# Patient Record
Sex: Female | Born: 1940
Health system: Southern US, Community
[De-identification: ages and names within clinical notes are randomized; demographics above are authoritative.]

## PROBLEM LIST (undated history)

## (undated) DIAGNOSIS — F32A Depression, unspecified: Secondary | ICD-10-CM

## (undated) DIAGNOSIS — E042 Nontoxic multinodular goiter: Secondary | ICD-10-CM

## (undated) DIAGNOSIS — C801 Malignant (primary) neoplasm, unspecified: Secondary | ICD-10-CM

## (undated) DIAGNOSIS — J449 Chronic obstructive pulmonary disease, unspecified: Secondary | ICD-10-CM

## (undated) DIAGNOSIS — K219 Gastro-esophageal reflux disease without esophagitis: Secondary | ICD-10-CM

## (undated) DIAGNOSIS — F329 Major depressive disorder, single episode, unspecified: Secondary | ICD-10-CM

## (undated) DIAGNOSIS — E785 Hyperlipidemia, unspecified: Secondary | ICD-10-CM

## (undated) DIAGNOSIS — I201 Angina pectoris with documented spasm: Secondary | ICD-10-CM

## (undated) DIAGNOSIS — M1990 Unspecified osteoarthritis, unspecified site: Secondary | ICD-10-CM

## (undated) DIAGNOSIS — F419 Anxiety disorder, unspecified: Secondary | ICD-10-CM

## (undated) DIAGNOSIS — I10 Essential (primary) hypertension: Secondary | ICD-10-CM

## (undated) HISTORY — PX: TUBAL LIGATION: SHX77

## (undated) HISTORY — PX: ABDOMINAL HYSTERECTOMY: SHX81

## (undated) HISTORY — DX: Essential (primary) hypertension: I10

## (undated) HISTORY — DX: Nontoxic multinodular goiter: E04.2

## (undated) HISTORY — DX: Malignant (primary) neoplasm, unspecified: C80.1

## (undated) HISTORY — DX: Anxiety disorder, unspecified: F41.9

## (undated) HISTORY — DX: Angina pectoris with documented spasm: I20.1

## (undated) HISTORY — PX: THYROIDECTOMY, PARTIAL: SHX18

## (undated) HISTORY — DX: Hyperlipidemia, unspecified: E78.5

## (undated) HISTORY — DX: Unspecified osteoarthritis, unspecified site: M19.90

## (undated) HISTORY — PX: VESICOVAGINAL FISTULA CLOSURE W/ TAH: SUR271

---

## 1998-08-29 ENCOUNTER — Encounter: Admission: RE | Admit: 1998-08-29 | Discharge: 1998-08-29 | Payer: Self-pay | Admitting: Internal Medicine

## 1998-09-11 ENCOUNTER — Encounter: Admission: RE | Admit: 1998-09-11 | Discharge: 1998-09-11 | Payer: Self-pay | Admitting: Internal Medicine

## 1998-11-14 ENCOUNTER — Encounter: Admission: RE | Admit: 1998-11-14 | Discharge: 1998-11-14 | Payer: Self-pay | Admitting: Internal Medicine

## 2000-03-10 ENCOUNTER — Emergency Department (HOSPITAL_COMMUNITY): Admission: EM | Admit: 2000-03-10 | Discharge: 2000-03-10 | Payer: Self-pay | Admitting: Emergency Medicine

## 2000-03-15 ENCOUNTER — Encounter: Admission: RE | Admit: 2000-03-15 | Discharge: 2000-03-15 | Payer: Self-pay | Admitting: Occupational Medicine

## 2000-03-15 ENCOUNTER — Encounter: Payer: Self-pay | Admitting: Occupational Medicine

## 2000-08-16 ENCOUNTER — Other Ambulatory Visit: Admission: RE | Admit: 2000-08-16 | Discharge: 2000-08-16 | Payer: Self-pay | Admitting: Internal Medicine

## 2000-12-26 ENCOUNTER — Ambulatory Visit (HOSPITAL_COMMUNITY): Admission: RE | Admit: 2000-12-26 | Discharge: 2000-12-26 | Payer: Self-pay | Admitting: *Deleted

## 2000-12-26 ENCOUNTER — Encounter (INDEPENDENT_AMBULATORY_CARE_PROVIDER_SITE_OTHER): Payer: Self-pay | Admitting: Specialist

## 2003-03-15 ENCOUNTER — Emergency Department (HOSPITAL_COMMUNITY): Admission: EM | Admit: 2003-03-15 | Discharge: 2003-03-16 | Payer: Self-pay | Admitting: Emergency Medicine

## 2004-02-12 ENCOUNTER — Encounter: Admission: RE | Admit: 2004-02-12 | Discharge: 2004-02-12 | Payer: Self-pay | Admitting: Internal Medicine

## 2007-04-21 ENCOUNTER — Ambulatory Visit (HOSPITAL_COMMUNITY): Admission: RE | Admit: 2007-04-21 | Discharge: 2007-04-21 | Payer: Self-pay | Admitting: Internal Medicine

## 2007-09-08 ENCOUNTER — Other Ambulatory Visit: Admission: RE | Admit: 2007-09-08 | Discharge: 2007-09-08 | Payer: Self-pay | Admitting: Internal Medicine

## 2007-09-14 ENCOUNTER — Encounter: Admission: RE | Admit: 2007-09-14 | Discharge: 2007-09-14 | Payer: Self-pay | Admitting: Internal Medicine

## 2008-04-15 ENCOUNTER — Encounter: Admission: RE | Admit: 2008-04-15 | Discharge: 2008-04-15 | Payer: Self-pay | Admitting: Internal Medicine

## 2008-09-24 ENCOUNTER — Encounter: Admission: RE | Admit: 2008-09-24 | Discharge: 2008-09-24 | Payer: Self-pay | Admitting: Internal Medicine

## 2008-09-25 ENCOUNTER — Encounter: Admission: RE | Admit: 2008-09-25 | Discharge: 2008-09-25 | Payer: Self-pay | Admitting: Internal Medicine

## 2008-10-01 ENCOUNTER — Encounter: Admission: RE | Admit: 2008-10-01 | Discharge: 2008-10-01 | Payer: Self-pay | Admitting: Internal Medicine

## 2008-10-01 ENCOUNTER — Emergency Department (HOSPITAL_COMMUNITY): Admission: EM | Admit: 2008-10-01 | Discharge: 2008-10-01 | Payer: Self-pay | Admitting: Emergency Medicine

## 2009-04-15 ENCOUNTER — Encounter: Admission: RE | Admit: 2009-04-15 | Discharge: 2009-04-15 | Payer: Self-pay | Admitting: Internal Medicine

## 2009-09-25 ENCOUNTER — Encounter: Admission: RE | Admit: 2009-09-25 | Discharge: 2009-09-25 | Payer: Self-pay | Admitting: Internal Medicine

## 2009-12-09 ENCOUNTER — Ambulatory Visit: Payer: Self-pay | Admitting: Cardiology

## 2009-12-22 ENCOUNTER — Emergency Department (HOSPITAL_COMMUNITY)
Admission: EM | Admit: 2009-12-22 | Discharge: 2009-12-22 | Payer: Self-pay | Source: Home / Self Care | Admitting: Emergency Medicine

## 2010-01-25 ENCOUNTER — Encounter: Payer: Self-pay | Admitting: Internal Medicine

## 2010-03-16 LAB — CBC
HCT: 40.6 % (ref 36.0–46.0)
Hemoglobin: 13.5 g/dL (ref 12.0–15.0)
MCH: 31 pg (ref 26.0–34.0)
MCHC: 33.3 g/dL (ref 30.0–36.0)
MCV: 93.1 fL (ref 78.0–100.0)
RDW: 14 % (ref 11.5–15.5)

## 2010-03-16 LAB — BASIC METABOLIC PANEL
BUN: 13 mg/dL (ref 6–23)
CO2: 20 mEq/L (ref 19–32)
GFR calc non Af Amer: >60 mL/min (ref >60)
Glucose, Bld: 122 mg/dL — ABNORMAL HIGH (ref 70–99)
Potassium: 3.4 mEq/L — ABNORMAL LOW (ref 3.5–5.1)
Sodium: 136 mEq/L (ref 135–145)

## 2010-03-16 LAB — DIFFERENTIAL
Basophils Absolute: 0.1 10*3/uL (ref 0.0–0.1)
Basophils Relative: 1 % (ref 0–1)
Eosinophils Absolute: 0.2 10*3/uL (ref 0.0–0.7)
Eosinophils Relative: 1 % (ref 0–5)
Monocytes Absolute: 2.2 10*3/uL — ABNORMAL HIGH (ref 0.1–1.0)
Monocytes Relative: 15 % — ABNORMAL HIGH (ref 3–12)
Neutro Abs: 9.9 10*3/uL — ABNORMAL HIGH (ref 1.7–7.7)

## 2010-05-22 NOTE — Op Note (Signed)
[facility]  Patient:    Sherri King, Sherri King Visit Number: [id number] MRN: [id number]          Service Type: END Location: ENDO Attending Physician:  Sabino Gasser Dictated by:   Sabino Gasser, M.D. Proc. Date: 12/26/00 Admit Date:  12/26/2000                             Operative Report  PROCEDURE:  Colonoscopy with biopsy.  INDICATIONS:  Colon polyp.  ANESTHESIA:  Demerol 80 mg, Versed 9 mg.  DESCRIPTION OF PROCEDURE:  With the patient mildly sedated in the left lateral decubitus position, the Olympus videoscopic colonoscope was inserted into the rectum and passed under direct vision into the cecum. The cecum was identified by the ileocecal valve and appendiceal orifice both of which were photographed. From this point, the colonoscope was slowly withdrawn taking circumferential views of the entire colonic mucosa, stopping only at 20 cm for an anal verge at which point a small polyp was seen, photographed, and removed using hot biopsy forceps.  Technique:  With setting of 20/20 blended current, we pulled back all the way to the rectum which appeared normal under direct view and then pulled through the anal canal in retroflex view. The patients vital signs and pulse oximeter reading remained stable. The patient tolerated the procedure well without apparent complications.  FINDINGS:  Small polyp at 20 cm, otherwise, unremarkable examination.  PLAN:  Await biopsy report. The patient is to call Dr. Virginia Rochester for result and follow up with Dr. Virginia Rochester as an outpatient. Dictated by:   Sabino Gasser, M.D. Attending Physician:  Sabino Gasser DD:  12/26/00 TD:  12/26/00 Job: [id number]

## 2011-04-06 ENCOUNTER — Encounter: Payer: Self-pay | Admitting: *Deleted

## 2011-05-26 ENCOUNTER — Encounter (INDEPENDENT_AMBULATORY_CARE_PROVIDER_SITE_OTHER): Payer: Medicare Other | Admitting: Ophthalmology

## 2011-05-26 DIAGNOSIS — H2510 Age-related nuclear cataract, unspecified eye: Secondary | ICD-10-CM

## 2011-05-26 DIAGNOSIS — H3530 Unspecified macular degeneration: Secondary | ICD-10-CM

## 2011-05-26 DIAGNOSIS — H43819 Vitreous degeneration, unspecified eye: Secondary | ICD-10-CM

## 2011-06-07 ENCOUNTER — Ambulatory Visit (INDEPENDENT_AMBULATORY_CARE_PROVIDER_SITE_OTHER): Payer: Medicare Other | Admitting: Cardiology

## 2011-06-07 ENCOUNTER — Encounter: Payer: Self-pay | Admitting: Cardiology

## 2011-06-07 VITALS — BP 140/80 | HR 83 | Ht 64.0 in | Wt 170.0 lb

## 2011-06-07 DIAGNOSIS — I201 Angina pectoris with documented spasm: Secondary | ICD-10-CM | POA: Insufficient documentation

## 2011-06-07 DIAGNOSIS — I10 Essential (primary) hypertension: Secondary | ICD-10-CM

## 2011-06-07 DIAGNOSIS — Z720 Tobacco use: Secondary | ICD-10-CM | POA: Insufficient documentation

## 2011-06-07 DIAGNOSIS — R0789 Other chest pain: Secondary | ICD-10-CM

## 2011-06-07 DIAGNOSIS — F17200 Nicotine dependence, unspecified, uncomplicated: Secondary | ICD-10-CM

## 2011-06-07 NOTE — Assessment & Plan Note (Signed)
Her current chest pain symptoms are very atypical and do not suggest a cardiac source. Her ECG is normal. Her exam is normal.

## 2011-06-07 NOTE — Patient Instructions (Signed)
Continue your current therapy.  Quit smoking completely.  I will see you back in 1 year.

## 2011-06-07 NOTE — Assessment & Plan Note (Signed)
Well controlled on isosorbide. I stressed the importance of smoking cessation.

## 2011-06-07 NOTE — Progress Notes (Signed)
Sherri King Date of Birth: [birth year] Medical Record #[id number]  History of Present Illness: [name] is seen today for yearly followup. She reports that she has done well over the past year. Her husband did pass away this year with metastatic prostate cancer. She has experienced some intermittent atypical chest pains. She has a pain in her left axilla that can occur at any time and lasts only a couple of seconds. At other times she feels a shooting pain through her breasts. She complains that she sweats a lot. She is trying to quit smoking and is currently smoking a half pack per day. She's also using a nicotine patch. She carries a diagnosis of vasospastic coronary disease. This is based on cardiac catheterization in 2009 which demonstrated an area of narrowing in the mid LAD that resolved with nitroglycerin. She has been on chronic isosorbide.  Current Outpatient Prescriptions on File Prior to Visit  Medication Sig Dispense Refill  . aspirin 81 MG tablet Take 81 mg by mouth daily.      Marland Kitchen atorvastatin (LIPITOR) 80 MG tablet Take 40 mg by mouth daily.       Marland Kitchen darifenacin (ENABLEX) 7.5 MG 24 hr tablet Take 7.5 mg by mouth daily.      . isosorbide mononitrate (IMDUR) 60 MG 24 hr tablet Take 60 mg by mouth daily.      . sertraline (ZOLOFT) 100 MG tablet Take 100 mg by mouth daily.      Marland Kitchen triamterene-hydrochlorothiazide (MAXZIDE-25) 37.5-25 MG per tablet Take 1 tablet by mouth daily.      . VENTOLIN HFA 108 (90 BASE) MCG/ACT inhaler         No Known Allergies  Past Medical History  Diagnosis Date  . Coronary artery disease   . Hypertension   . Hyperlipidemia   . Anxiety   . Cancer     cervical  . Arthritis     Past Surgical History  Procedure Date  . Vesicovaginal fistula closure w/ tah   . Breast surgery     removal of several nodules from right breast    History  Smoking status  . Current Everyday Smoker -- 0.5 packs/day for 40 years  Smokeless tobacco  . Not on  file    History  Alcohol Use  . Yes    occasional beer    Family History  Problem Relation Age of Onset  . Heart failure Mother   . Stroke Mother   . Cancer Father     lung  . Cancer Sister   . Cancer Sister   . Heart disease Sister     Review of Systems: The review of systems is positive for arthritis.  All other systems were reviewed and are negative.  Physical Exam: BP 140/80  Pulse 83  Ht 5\' 4"  (1.626 m)  Wt 170 lb (77.111 kg)  BMI 29.18 kg/m2 She is a pleasant white female in no acute distress.The patient is alert and oriented x 3.  The mood and affect are normal.  The skin is warm and dry.  Color is normal.  The HEENT exam reveals that the sclera are nonicteric.  The mucous membranes are moist.  The carotids are 2+ without bruits.  There is no thyromegaly.  There is no JVD.  The lungs are clear.  The chest wall is non tender.  The heart exam reveals a regular rate with a normal S1 and S2.  There are no murmurs, gallops, or rubs.  The PMI is not displaced.   Abdominal exam reveals good bowel sounds.  There is no guarding or rebound.  There is no hepatosplenomegaly or tenderness.  There are no masses.  Exam of the legs reveal no clubbing, cyanosis, or edema.  The legs are without rashes.  The distal pulses are intact.  Cranial nerves II - XII are intact.  Motor and sensory functions are intact.  The gait is normal.  LABORATORY DATA:  ECG today is normal. Assessment / Plan:

## 2011-06-07 NOTE — Assessment & Plan Note (Signed)
I encouraged her to completely stop smoking. I cautioned her against using nicotine patches at the same time she was continuing to smoke.

## 2012-03-02 ENCOUNTER — Other Ambulatory Visit: Payer: Self-pay | Admitting: Gastroenterology

## 2012-03-16 ENCOUNTER — Other Ambulatory Visit: Payer: Self-pay | Admitting: Internal Medicine

## 2012-03-23 ENCOUNTER — Ambulatory Visit
Admission: RE | Admit: 2012-03-23 | Discharge: 2012-03-23 | Disposition: A | Discharge status: Home / Self Care | Payer: Medicare Other | Source: Ambulatory Visit | Attending: Internal Medicine | Admitting: Internal Medicine

## 2012-05-25 ENCOUNTER — Ambulatory Visit (INDEPENDENT_AMBULATORY_CARE_PROVIDER_SITE_OTHER): Payer: Medicare Other | Admitting: Ophthalmology

## 2012-05-31 ENCOUNTER — Ambulatory Visit (INDEPENDENT_AMBULATORY_CARE_PROVIDER_SITE_OTHER): Payer: Self-pay | Admitting: Ophthalmology

## 2012-06-19 ENCOUNTER — Ambulatory Visit (INDEPENDENT_AMBULATORY_CARE_PROVIDER_SITE_OTHER): Payer: Self-pay | Admitting: Ophthalmology

## 2013-03-12 ENCOUNTER — Other Ambulatory Visit: Payer: Self-pay | Admitting: Internal Medicine

## 2013-03-12 ENCOUNTER — Telehealth: Payer: Self-pay

## 2013-03-12 DIAGNOSIS — E041 Nontoxic single thyroid nodule: Secondary | ICD-10-CM

## 2013-03-12 DIAGNOSIS — R1011 Right upper quadrant pain: Secondary | ICD-10-CM

## 2013-03-12 NOTE — Telephone Encounter (Signed)
Patient called received lab work from [facility] noticed last office visit with Dr.Jordan 06/07/11.Appointment scheduled with Dr.Jordan 04/19/13 at 9:15 am.

## 2013-03-19 ENCOUNTER — Ambulatory Visit
Admission: RE | Admit: 2013-03-19 | Discharge: 2013-03-19 | Disposition: A | Discharge status: Home / Self Care | Payer: Medicare Other | Source: Ambulatory Visit | Attending: Internal Medicine | Admitting: Internal Medicine

## 2013-03-19 DIAGNOSIS — E041 Nontoxic single thyroid nodule: Secondary | ICD-10-CM

## 2013-03-19 DIAGNOSIS — R1011 Right upper quadrant pain: Secondary | ICD-10-CM

## 2013-04-19 ENCOUNTER — Ambulatory Visit: Payer: Medicare Other | Admitting: Cardiology

## 2013-05-04 ENCOUNTER — Ambulatory Visit (INDEPENDENT_AMBULATORY_CARE_PROVIDER_SITE_OTHER): Payer: Medicare Other | Admitting: Nurse Practitioner

## 2013-05-04 ENCOUNTER — Encounter: Payer: Self-pay | Admitting: Nurse Practitioner

## 2013-05-04 VITALS — BP 130/80 | HR 74 | Ht 63.0 in | Wt 156.0 lb

## 2013-05-04 DIAGNOSIS — R0789 Other chest pain: Secondary | ICD-10-CM

## 2013-05-04 DIAGNOSIS — F17200 Nicotine dependence, unspecified, uncomplicated: Secondary | ICD-10-CM

## 2013-05-04 DIAGNOSIS — I201 Angina pectoris with documented spasm: Secondary | ICD-10-CM

## 2013-05-04 DIAGNOSIS — Z720 Tobacco use: Secondary | ICD-10-CM

## 2013-05-04 DIAGNOSIS — I10 Essential (primary) hypertension: Secondary | ICD-10-CM

## 2013-05-04 DIAGNOSIS — E785 Hyperlipidemia, unspecified: Secondary | ICD-10-CM | POA: Insufficient documentation

## 2013-05-04 MED ORDER — NITROGLYCERIN 0.4 MG SL SUBL: 0.4000 mg | SUBLINGUAL_TABLET | SUBLINGUAL | Status: DC | PRN

## 2013-05-04 NOTE — Patient Instructions (Signed)
I think you are doing well except for the smoking - try to work on stopping  I will send in a RX for NTG to have on hand.  Use your NTG under your tongue for recurrent chest pain. May take one tablet every 5 minutes. If you are still having discomfort after 3 tablets in 15 minutes, call 911.  See Dr. Martinique in a year  Call the [place] office at [phone] if you have any questions, problems or concerns.

## 2013-05-04 NOTE — Progress Notes (Signed)
Sherri King Date of Birth: 1940/11/11 Medical Record #[id number]  History of Present Illness: [name] is seen back today for a follow up visit. Seen for Sherri King. She has ongoing tobacco abuse, vasospastic CAD with last cath in 2009 showing an area of narrowing in the mid LAD that resolved with NTG. She is on chronic nitrate therapy. Other issues include HTN and HLD.   Last seen here in June of 2013.   Comes in today. [name] No chest pain. Says everything is "fine". She is off all of her cardiac medicines and lipid lowering agents. Has been off for a couple of years she says. Stopped because she was "doing fine". Continues to smoke. Actively trying to lose weight due to a "fatty liver". Labs back in February with Sherri King reviewed.    Current Outpatient Prescriptions  Medication Sig Dispense Refill  . acetaminophen (TYLENOL) 500 MG tablet Take 500 mg by mouth every 6 (six) hours as needed.      Marland Kitchen buPROPion (WELLBUTRIN SR) 150 MG 12 hr tablet Take 150 mg by mouth 2 (two) times daily.      . carboxymethylcellulose (REFRESH PLUS) 0.5 % SOLN Place 1 drop into both eyes daily.      . cholecalciferol (VITAMIN D) 1000 UNITS tablet Take 1,000 Units by mouth 2 (two) times daily.      . cyclobenzaprine (FLEXERIL) 10 MG tablet Take 10 mg by mouth at bedtime.      . mirabegron ER (MYRBETRIQ) 25 MG TB24 tablet Take 25 mg by mouth daily.      . Multiple Vitamins-Minerals (MULTIVITAMIN WITH MINERALS) tablet Take 1 tablet by mouth daily.      Marland Kitchen omeprazole (PRILOSEC) 20 MG capsule Take 20 mg by mouth daily.      . sertraline (ZOLOFT) 100 MG tablet Take 100 mg by mouth daily.      . VENTOLIN HFA 108 (90 BASE) MCG/ACT inhaler       . vitamin C (ASCORBIC ACID) 500 MG tablet Take 500 mg by mouth 2 (two) times daily.       No current facility-administered medications for this visit.    No Known Allergies  Past Medical History  Diagnosis Date  . Coronary vasospasm   .  Hypertension   . Hyperlipidemia   . Anxiety   . Cancer     cervical  . Arthritis   . Multiple thyroid nodules     Past Surgical History  Procedure Laterality Date  . Vesicovaginal fistula closure w/ tah    . Breast surgery      removal of several nodules from right breast    History  Smoking status  . Current Every Day Smoker -- 0.50 packs/day for 40 years  Smokeless tobacco  . Not on file    History  Alcohol Use  . Yes    Comment: occasional beer    Family History  Problem Relation Age of Onset  . Heart failure Mother   . Stroke Mother   . Cancer Father     lung  . Cancer Sister   . Cancer Sister   . Heart disease Sister     Review of Systems: The review of systems is per the HPI.  All other systems were reviewed and are negative.  Physical Exam: BP 130/80  Pulse 74  Ht 5\' 3"  (1.6 m)  Wt 156 lb (70.761 kg)  BMI 27.64 kg/m2 Patient is very pleasant and in no  acute distress. She has lost 14 pounds in almost 2 years. Skin is warm and dry. Color is normal.  HEENT is unremarkable. Normocephalic/atraumatic. PERRL. Sclera are nonicteric. Neck is supple. No masses. No JVD. Lungs are clear. Cardiac exam shows a regular rate and rhythm. Abdomen is soft. Extremities are without edema. Gait and ROM are intact. No gross neurologic deficits noted.  Wt Readings from Last 3 Encounters:  05/04/13 156 lb (70.761 kg)  06/07/11 170 lb (77.111 kg)     LABORATORY DATA:  Lab Results  Component Value Date   WBC 14.3* 12/22/2009   HGB 13.5 12/22/2009   HCT 40.6 12/22/2009   PLT 228 12/22/2009   GLUCOSE 122* 12/22/2009   NA 136 12/22/2009   K 3.4* 12/22/2009   CL 105 12/22/2009   CREATININE 0.57 12/22/2009   BUN 13 12/22/2009   CO2 20 12/22/2009     Assessment / Plan:  1. Vasospastic CAD - no symptoms reported. She is off her nitrates and doing ok. Would like for her to have sl NTG on hand - this was sent in to her drug store. See back in a year.   2. Tobacco  abuse - cessation encouraged.   3. HTN - BP is good. She is no longer on medicines.   4. HLD - off treatment and actively losing weight. Labs by PCP.  See back in a year.   Patient is agreeable to this plan and will call if any problems develop in the interim.   Burtis Junes, RN, [place] [address] [place], [place]  [address] [phone]

## 2013-10-10 ENCOUNTER — Encounter: Payer: Self-pay | Admitting: Cardiology

## 2014-01-04 HISTORY — PX: BREAST BIOPSY: SHX20

## 2014-01-04 HISTORY — PX: BREAST SURGERY: SHX581

## 2014-04-26 ENCOUNTER — Telehealth: Payer: Self-pay | Admitting: Cardiology

## 2014-04-26 NOTE — Telephone Encounter (Signed)
Received records from [facility] for appointment on 06/21/14 with Dr Martinique.  Records given to [facility] (medical records) for Dr Doug Sou schedule on 06/21/14. lp

## 2014-05-02 ENCOUNTER — Other Ambulatory Visit: Payer: Self-pay | Admitting: Gastroenterology

## 2014-06-21 ENCOUNTER — Ambulatory Visit (INDEPENDENT_AMBULATORY_CARE_PROVIDER_SITE_OTHER): Payer: Medicare Other | Admitting: Cardiology

## 2014-06-21 ENCOUNTER — Encounter: Payer: Self-pay | Admitting: Cardiology

## 2014-06-21 VITALS — BP 146/87 | HR 75 | Ht 64.0 in | Wt 170.3 lb

## 2014-06-21 DIAGNOSIS — I201 Angina pectoris with documented spasm: Secondary | ICD-10-CM | POA: Diagnosis not present

## 2014-06-21 DIAGNOSIS — Z720 Tobacco use: Secondary | ICD-10-CM | POA: Diagnosis not present

## 2014-06-21 DIAGNOSIS — I10 Essential (primary) hypertension: Secondary | ICD-10-CM

## 2014-06-21 NOTE — Patient Instructions (Signed)
Continue your current therapy  Focus on restricting your salt intake   Congratulations on quitting smoking.  I will see you in one year.

## 2014-06-21 NOTE — Progress Notes (Signed)
Sherri King Date of Birth: 1940/07/17 Medical Record #[id number]  History of Present Illness: Sherri King is seen for follow up coronary vasospasm.  She has a history of  tobacco abuse, vasospastic CAD with last cath in 2009 showing an area of narrowing in the mid LAD that resolved with NTG. She was previously on nitrate therapy but this was stopped over a year ago. Other issues include HTN and HLD.  On follow up today she is doing well. No angina. Rare sharp pain. She is off all of her cardiac medicines and lipid lowering agents. Has been off for a couple of years. She did quit smoking in April.    Current Outpatient Prescriptions  Medication Sig Dispense Refill  . acetaminophen (TYLENOL) 500 MG tablet Take 500 mg by mouth every 6 (six) hours as needed.    Marland Kitchen buPROPion (WELLBUTRIN SR) 150 MG 12 hr tablet Take 150 mg by mouth 2 (two) times daily.    . carboxymethylcellulose (REFRESH PLUS) 0.5 % SOLN Place 1 drop into both eyes daily.    . cholecalciferol (VITAMIN D) 1000 UNITS tablet Take 1,000 Units by mouth 2 (two) times daily.    . cyclobenzaprine (FLEXERIL) 10 MG tablet Take 10 mg by mouth at bedtime.    . mirabegron ER (MYRBETRIQ) 25 MG TB24 tablet Take 25 mg by mouth daily.    . Multiple Vitamins-Minerals (MULTIVITAMIN WITH MINERALS) tablet Take 1 tablet by mouth daily.    . nitroGLYCERIN (NITROSTAT) 0.4 MG SL tablet Place 1 tablet (0.4 mg total) under the tongue every 5 (five) minutes as needed for chest pain. 25 tablet 3  . omeprazole (PRILOSEC) 20 MG capsule Take 20 mg by mouth daily.    . sertraline (ZOLOFT) 100 MG tablet Take 100 mg by mouth daily.    . vitamin C (ASCORBIC ACID) 500 MG tablet Take 500 mg by mouth 2 (two) times daily.     No current facility-administered medications for this visit.    No Known Allergies  Past Medical History  Diagnosis Date  . Coronary vasospasm   . Hypertension   . Hyperlipidemia   . Anxiety   . Cancer     cervical  . Arthritis    . Multiple thyroid nodules     Past Surgical History  Procedure Laterality Date  . Vesicovaginal fistula closure w/ tah    . Breast surgery      removal of several nodules from right breast    History  Smoking status  . Current Every Day Smoker -- 0.50 packs/day for 40 years  Smokeless tobacco  . Not on file    History  Alcohol Use  . Yes    Comment: occasional beer    Family History  Problem Relation Age of Onset  . Heart failure Mother   . Stroke Mother   . Cancer Father     lung  . Cancer Sister   . Cancer Sister   . Heart disease Sister     Review of Systems: The review of systems is per the HPI.  She does note some swelling in her ankle. She does eat a lot of salt. All other systems were reviewed and are negative.  Physical Exam: BP 146/87 mmHg  Pulse 75  Ht 5\' 4"  (1.626 m)  Wt 77.248 kg (170 lb 4.8 oz)  BMI 29.22 kg/m2 Patient is very pleasant and in no acute distress.  Skin is warm and dry. Color is normal.  HEENT is unremarkable.  Normocephalic/atraumatic. PERRL. Sclera are nonicteric. Neck is supple. No masses. No JVD. Lungs are clear. Cardiac exam shows a regular rate and rhythm. Normal S1-2. Abdomen is soft. Extremities 1+ edema. Gait and ROM are intact. No gross neurologic deficits noted.  Wt Readings from Last 3 Encounters:  06/21/14 77.248 kg (170 lb 4.8 oz)  05/04/13 70.761 kg (156 lb)  06/07/11 77.111 kg (170 lb)     LABORATORY DATA:  Lab Results  Component Value Date   WBC 14.3* 12/22/2009   HGB 13.5 12/22/2009   HCT 40.6 12/22/2009   PLT 228 12/22/2009   GLUCOSE 122* 12/22/2009   NA 136 12/22/2009   K 3.4* 12/22/2009   CL 105 12/22/2009   CREATININE 0.57 12/22/2009   BUN 13 12/22/2009   CO2 20 12/22/2009   Ecg today shows NSR with normal Ecg. I have personally reviewed and interpreted this study.   Assessment / Plan:  1. Vasospastic CAD - no symptoms reported. She is off her nitrates and doing ok. I think the most important  thing is that she quit smoking.  See back in a year.   2. Tobacco abuse - now stopped.  3. HTN - BP is fair. On no meds. Encourage sodium restriction.  4. HLD - off treatment and actively losing weight. Labs by PCP.  See back in a year.

## 2014-07-30 ENCOUNTER — Other Ambulatory Visit (HOSPITAL_COMMUNITY): Payer: Self-pay | Admitting: Internal Medicine

## 2014-07-30 DIAGNOSIS — Z1231 Encounter for screening mammogram for malignant neoplasm of breast: Secondary | ICD-10-CM

## 2014-08-07 ENCOUNTER — Ambulatory Visit (HOSPITAL_COMMUNITY)
Admission: RE | Admit: 2014-08-07 | Discharge: 2014-08-07 | Disposition: A | Discharge status: Home / Self Care | Payer: Medicare Other | Source: Ambulatory Visit | Attending: Internal Medicine | Admitting: Internal Medicine

## 2014-08-07 DIAGNOSIS — Z1231 Encounter for screening mammogram for malignant neoplasm of breast: Secondary | ICD-10-CM | POA: Insufficient documentation

## 2014-08-09 ENCOUNTER — Other Ambulatory Visit: Payer: Self-pay | Admitting: Internal Medicine

## 2014-08-09 DIAGNOSIS — R928 Other abnormal and inconclusive findings on diagnostic imaging of breast: Secondary | ICD-10-CM

## 2014-08-15 ENCOUNTER — Ambulatory Visit
Admission: RE | Admit: 2014-08-15 | Discharge: 2014-08-15 | Disposition: A | Discharge status: Home / Self Care | Payer: Medicare Other | Source: Ambulatory Visit | Attending: Internal Medicine | Admitting: Internal Medicine

## 2014-08-15 ENCOUNTER — Other Ambulatory Visit: Payer: Self-pay | Admitting: Internal Medicine

## 2014-08-15 DIAGNOSIS — R928 Other abnormal and inconclusive findings on diagnostic imaging of breast: Secondary | ICD-10-CM

## 2014-08-21 ENCOUNTER — Other Ambulatory Visit: Payer: Self-pay | Admitting: Internal Medicine

## 2014-08-21 ENCOUNTER — Ambulatory Visit
Admission: RE | Admit: 2014-08-21 | Discharge: 2014-08-21 | Disposition: A | Discharge status: Home / Self Care | Payer: Medicare Other | Source: Ambulatory Visit | Attending: Internal Medicine | Admitting: Internal Medicine

## 2014-08-21 DIAGNOSIS — R928 Other abnormal and inconclusive findings on diagnostic imaging of breast: Secondary | ICD-10-CM

## 2014-08-21 HISTORY — PX: BREAST BIOPSY: SHX20

## 2014-09-23 ENCOUNTER — Telehealth: Payer: Self-pay

## 2014-09-23 NOTE — Telephone Encounter (Signed)
Received surgical clearance from [facility].Dr.Jordan cleared patient for upcoming surgery.Clearance faxed back to fax # [phone].

## 2014-10-05 HISTORY — PX: BREAST EXCISIONAL BIOPSY: SUR124

## 2014-10-08 ENCOUNTER — Other Ambulatory Visit: Payer: Self-pay | Admitting: General Surgery

## 2014-10-08 DIAGNOSIS — D242 Benign neoplasm of left breast: Secondary | ICD-10-CM

## 2014-10-08 DIAGNOSIS — D249 Benign neoplasm of unspecified breast: Secondary | ICD-10-CM | POA: Insufficient documentation

## 2014-10-09 ENCOUNTER — Other Ambulatory Visit: Payer: Self-pay | Admitting: General Surgery

## 2014-10-09 DIAGNOSIS — D242 Benign neoplasm of left breast: Secondary | ICD-10-CM

## 2014-10-28 ENCOUNTER — Ambulatory Visit
Admission: RE | Admit: 2014-10-28 | Discharge: 2014-10-28 | Disposition: A | Discharge status: Home / Self Care | Payer: Medicare Other | Source: Ambulatory Visit | Attending: General Surgery | Admitting: General Surgery

## 2014-10-28 ENCOUNTER — Encounter (HOSPITAL_BASED_OUTPATIENT_CLINIC_OR_DEPARTMENT_OTHER)
Admission: RE | Admit: 2014-10-28 | Discharge: 2014-10-28 | Disposition: A | Discharge status: Home / Self Care | Payer: Medicare Other | Source: Ambulatory Visit | Attending: General Surgery | Admitting: General Surgery

## 2014-10-28 ENCOUNTER — Encounter (HOSPITAL_BASED_OUTPATIENT_CLINIC_OR_DEPARTMENT_OTHER): Payer: Self-pay | Admitting: *Deleted

## 2014-10-28 DIAGNOSIS — N6022 Fibroadenosis of left breast: Secondary | ICD-10-CM | POA: Diagnosis not present

## 2014-10-28 DIAGNOSIS — Z79899 Other long term (current) drug therapy: Secondary | ICD-10-CM | POA: Diagnosis not present

## 2014-10-28 DIAGNOSIS — I10 Essential (primary) hypertension: Secondary | ICD-10-CM | POA: Diagnosis not present

## 2014-10-28 DIAGNOSIS — D242 Benign neoplasm of left breast: Secondary | ICD-10-CM

## 2014-10-28 DIAGNOSIS — N63 Unspecified lump in breast: Secondary | ICD-10-CM | POA: Diagnosis present

## 2014-10-28 DIAGNOSIS — J449 Chronic obstructive pulmonary disease, unspecified: Secondary | ICD-10-CM | POA: Diagnosis not present

## 2014-10-28 DIAGNOSIS — Z87891 Personal history of nicotine dependence: Secondary | ICD-10-CM | POA: Diagnosis not present

## 2014-10-28 LAB — CBC WITH DIFFERENTIAL/PLATELET
Basophils Absolute: 0.1 10*3/uL (ref 0.0–0.1)
Basophils Relative: 1 %
Eosinophils Absolute: 0.7 10*3/uL (ref 0.0–0.7)
Eosinophils Relative: 9 %
HCT: 43 % (ref 36.0–46.0)
Hemoglobin: 13.9 g/dL (ref 12.0–15.0)
LYMPHS PCT: 24 %
Lymphs Abs: 2.1 10*3/uL (ref 0.7–4.0)
MCH: 30.6 pg (ref 26.0–34.0)
MCHC: 32.3 g/dL (ref 30.0–36.0)
MCV: 94.7 fL (ref 78.0–100.0)
MONOS PCT: 14 %
Monocytes Absolute: 1.2 10*3/uL — ABNORMAL HIGH (ref 0.1–1.0)
Neutro Abs: 4.7 10*3/uL (ref 1.7–7.7)
Neutrophils Relative %: 54 %
Platelets: 244 10*3/uL (ref 150–400)
RBC: 4.54 MIL/uL (ref 3.87–5.11)
RDW: 12.9 % (ref 11.5–15.5)
WBC: 8.7 10*3/uL (ref 4.0–10.5)

## 2014-10-28 LAB — BASIC METABOLIC PANEL
Anion gap: 5 (ref 5–15)
BUN: 11 mg/dL (ref 6–20)
CHLORIDE: 109 mmol/L (ref 101–111)
CO2: 27 mmol/L (ref 22–32)
CREATININE: 0.52 mg/dL (ref 0.44–1.00)
Calcium: 9.5 mg/dL (ref 8.9–10.3)
GFR calc Af Amer: >60 mL/min (ref >60)
GFR calc non Af Amer: >60 mL/min (ref >60)
GLUCOSE: 110 mg/dL — AB (ref 65–99)
POTASSIUM: 4.5 mmol/L (ref 3.5–5.1)
SODIUM: 141 mmol/L (ref 135–145)

## 2014-10-31 ENCOUNTER — Ambulatory Visit (HOSPITAL_BASED_OUTPATIENT_CLINIC_OR_DEPARTMENT_OTHER): Payer: Medicare Other | Admitting: Anesthesiology

## 2014-10-31 ENCOUNTER — Encounter (HOSPITAL_BASED_OUTPATIENT_CLINIC_OR_DEPARTMENT_OTHER): Payer: Self-pay | Admitting: Anesthesiology

## 2014-10-31 ENCOUNTER — Encounter (HOSPITAL_BASED_OUTPATIENT_CLINIC_OR_DEPARTMENT_OTHER)
Admission: RE | Disposition: A | Discharge status: Home / Self Care | Payer: Self-pay | Source: Ambulatory Visit | Attending: General Surgery

## 2014-10-31 ENCOUNTER — Ambulatory Visit (HOSPITAL_BASED_OUTPATIENT_CLINIC_OR_DEPARTMENT_OTHER)
Admission: RE | Admit: 2014-10-31 | Discharge: 2014-10-31 | Disposition: A | Discharge status: Home / Self Care | Payer: Medicare Other | Source: Ambulatory Visit | Attending: General Surgery | Admitting: General Surgery

## 2014-10-31 ENCOUNTER — Ambulatory Visit
Admission: RE | Admit: 2014-10-31 | Discharge: 2014-10-31 | Disposition: A | Discharge status: Home / Self Care | Payer: Medicare Other | Source: Ambulatory Visit | Attending: General Surgery | Admitting: General Surgery

## 2014-10-31 DIAGNOSIS — N6022 Fibroadenosis of left breast: Secondary | ICD-10-CM | POA: Diagnosis not present

## 2014-10-31 DIAGNOSIS — J449 Chronic obstructive pulmonary disease, unspecified: Secondary | ICD-10-CM | POA: Diagnosis not present

## 2014-10-31 DIAGNOSIS — I10 Essential (primary) hypertension: Secondary | ICD-10-CM | POA: Diagnosis not present

## 2014-10-31 DIAGNOSIS — D242 Benign neoplasm of left breast: Secondary | ICD-10-CM

## 2014-10-31 DIAGNOSIS — Z87891 Personal history of nicotine dependence: Secondary | ICD-10-CM | POA: Diagnosis not present

## 2014-10-31 DIAGNOSIS — Z79899 Other long term (current) drug therapy: Secondary | ICD-10-CM | POA: Insufficient documentation

## 2014-10-31 HISTORY — DX: Chronic obstructive pulmonary disease, unspecified: J44.9

## 2014-10-31 HISTORY — PX: RADIOACTIVE SEED GUIDED EXCISIONAL BREAST BIOPSY: SHX6490

## 2014-10-31 HISTORY — DX: Depression, unspecified: F32.A

## 2014-10-31 HISTORY — DX: Major depressive disorder, single episode, unspecified: F32.9

## 2014-10-31 HISTORY — DX: Gastro-esophageal reflux disease without esophagitis: K21.9

## 2014-10-31 SURGERY — RADIOACTIVE SEED GUIDED BREAST BIOPSY
Anesthesia: General | Site: Breast | Laterality: Left

## 2014-10-31 MED ORDER — LIDOCAINE HCL (CARDIAC) 20 MG/ML IV SOLN
INTRAVENOUS | Status: AC
  Filled 2014-10-31: qty 5

## 2014-10-31 MED ORDER — PROPOFOL 10 MG/ML IV BOLUS
INTRAVENOUS | Status: DC | PRN
  Administered 2014-10-31: 150 mg via INTRAVENOUS

## 2014-10-31 MED ORDER — LIDOCAINE HCL (CARDIAC) 20 MG/ML IV SOLN
INTRAVENOUS | Status: DC | PRN
  Administered 2014-10-31: 50 mg via INTRAVENOUS

## 2014-10-31 MED ORDER — MIDAZOLAM HCL 2 MG/2ML IJ SOLN: 1.0000 mg | INTRAMUSCULAR | Status: DC | PRN

## 2014-10-31 MED ORDER — DEXAMETHASONE SODIUM PHOSPHATE 10 MG/ML IJ SOLN
INTRAMUSCULAR | Status: AC
  Filled 2014-10-31: qty 1

## 2014-10-31 MED ORDER — DEXAMETHASONE SODIUM PHOSPHATE 4 MG/ML IJ SOLN
INTRAMUSCULAR | Status: DC | PRN
  Administered 2014-10-31: 10 mg via INTRAVENOUS

## 2014-10-31 MED ORDER — PROPOFOL 10 MG/ML IV BOLUS
INTRAVENOUS | Status: AC
  Filled 2014-10-31: qty 20

## 2014-10-31 MED ORDER — PHENYLEPHRINE HCL 10 MG/ML IJ SOLN
INTRAMUSCULAR | Status: DC | PRN
  Administered 2014-10-31 (×2): 40 ug via INTRAVENOUS

## 2014-10-31 MED ORDER — HYDROCODONE-ACETAMINOPHEN 10-325 MG PO TABS: 1.0000 | ORAL_TABLET | Freq: Four times a day (QID) | ORAL | Status: AC | PRN

## 2014-10-31 MED ORDER — FENTANYL CITRATE (PF) 100 MCG/2ML IJ SOLN
INTRAMUSCULAR | Status: AC
  Filled 2014-10-31: qty 4

## 2014-10-31 MED ORDER — GLYCOPYRROLATE 0.2 MG/ML IJ SOLN: 0.2000 mg | Freq: Once | INTRAMUSCULAR | Status: DC | PRN

## 2014-10-31 MED ORDER — LACTATED RINGERS IV SOLN
INTRAVENOUS | Status: DC
  Administered 2014-10-31: 13:00:00 via INTRAVENOUS

## 2014-10-31 MED ORDER — FENTANYL CITRATE (PF) 100 MCG/2ML IJ SOLN: 50.0000 ug | INTRAMUSCULAR | Status: DC | PRN

## 2014-10-31 MED ORDER — CEFAZOLIN SODIUM-DEXTROSE 2-3 GM-% IV SOLR
2.0000 g | INTRAVENOUS | Status: AC
  Administered 2014-10-31: 2 g via INTRAVENOUS

## 2014-10-31 MED ORDER — OXYCODONE HCL 5 MG PO TABS
ORAL_TABLET | ORAL | Status: AC
  Filled 2014-10-31: qty 1

## 2014-10-31 MED ORDER — ONDANSETRON HCL 4 MG/2ML IJ SOLN
INTRAMUSCULAR | Status: AC
  Filled 2014-10-31: qty 2

## 2014-10-31 MED ORDER — CEFAZOLIN SODIUM-DEXTROSE 2-3 GM-% IV SOLR
INTRAVENOUS | Status: AC
  Filled 2014-10-31: qty 50

## 2014-10-31 MED ORDER — MEPERIDINE HCL 25 MG/ML IJ SOLN: 6.2500 mg | INTRAMUSCULAR | Status: DC | PRN

## 2014-10-31 MED ORDER — OXYCODONE HCL 5 MG PO TABS
5.0000 mg | ORAL_TABLET | Freq: Once | ORAL | Status: AC
Start: 2014-10-31 — End: 2014-10-31
  Administered 2014-10-31: 5 mg via ORAL

## 2014-10-31 MED ORDER — SCOPOLAMINE 1 MG/3DAYS TD PT72: 1.0000 | MEDICATED_PATCH | Freq: Once | TRANSDERMAL | Status: DC | PRN

## 2014-10-31 MED ORDER — BUPIVACAINE HCL (PF) 0.25 % IJ SOLN
INTRAMUSCULAR | Status: DC | PRN
  Administered 2014-10-31: 10 mL

## 2014-10-31 MED ORDER — FENTANYL CITRATE (PF) 100 MCG/2ML IJ SOLN
25.0000 ug | INTRAMUSCULAR | Status: DC | PRN
Start: 2014-10-31 — End: 2014-10-31

## 2014-10-31 MED ORDER — FENTANYL CITRATE (PF) 100 MCG/2ML IJ SOLN
INTRAMUSCULAR | Status: DC | PRN
  Administered 2014-10-31: 25 ug via INTRAVENOUS
  Administered 2014-10-31: 100 ug via INTRAVENOUS
  Administered 2014-10-31 (×3): 25 ug via INTRAVENOUS

## 2014-10-31 SURGICAL SUPPLY — 67 items
APPLIER CLIP 9.375 MED OPEN (MISCELLANEOUS)
APR CLP MED 9.3 20 MLT OPN (MISCELLANEOUS)
BINDER BREAST LRG (GAUZE/BANDAGES/DRESSINGS) ×2 IMPLANT
BINDER BREAST MEDIUM (GAUZE/BANDAGES/DRESSINGS) IMPLANT
BINDER BREAST XLRG (GAUZE/BANDAGES/DRESSINGS) IMPLANT
BINDER BREAST XXLRG (GAUZE/BANDAGES/DRESSINGS) IMPLANT
BLADE SURG 15 STRL LF DISP TIS (BLADE) ×1 IMPLANT
BLADE SURG 15 STRL SS (BLADE) ×3
CANISTER SUC SOCK COL 7IN (MISCELLANEOUS) IMPLANT
CANISTER SUCT 1200ML W/VALVE (MISCELLANEOUS) IMPLANT
CHLORAPREP W/TINT 26ML (MISCELLANEOUS) ×3 IMPLANT
CLIP APPLIE 9.375 MED OPEN (MISCELLANEOUS) IMPLANT
CLOSURE WOUND 1/2 X4 (GAUZE/BANDAGES/DRESSINGS) ×1
COVER BACK TABLE 60X90IN (DRAPES) ×3 IMPLANT
COVER MAYO STAND STRL (DRAPES) ×3 IMPLANT
COVER PROBE W GEL 5X96 (DRAPES) ×3 IMPLANT
DECANTER SPIKE VIAL GLASS SM (MISCELLANEOUS) IMPLANT
DEVICE DUBIN W/COMP PLATE 8390 (MISCELLANEOUS) ×3 IMPLANT
DRAPE LAPAROSCOPIC ABDOMINAL (DRAPES) ×3 IMPLANT
DRAPE UTILITY XL STRL (DRAPES) ×3 IMPLANT
DRSG TEGADERM 4X4.75 (GAUZE/BANDAGES/DRESSINGS) IMPLANT
ELECT COATED BLADE 2.86 ST (ELECTRODE) ×3 IMPLANT
ELECT REM PT RETURN 9FT ADLT (ELECTROSURGICAL) ×3
ELECTRODE REM PT RTRN 9FT ADLT (ELECTROSURGICAL) ×1 IMPLANT
GLOVE BIO SURGEON STRL SZ7 (GLOVE) ×6 IMPLANT
GLOVE BIOGEL M 7.0 STRL (GLOVE) ×2 IMPLANT
GLOVE BIOGEL PI IND STRL 7.0 (GLOVE) IMPLANT
GLOVE BIOGEL PI IND STRL 7.5 (GLOVE) ×1 IMPLANT
GLOVE BIOGEL PI IND STRL 8 (GLOVE) IMPLANT
GLOVE BIOGEL PI INDICATOR 7.0 (GLOVE) ×2
GLOVE BIOGEL PI INDICATOR 7.5 (GLOVE) ×2
GLOVE BIOGEL PI INDICATOR 8 (GLOVE) ×2
GLOVE SURG SS PI 7.0 STRL IVOR (GLOVE) ×2 IMPLANT
GOWN STRL REUS W/ TWL LRG LVL3 (GOWN DISPOSABLE) ×2 IMPLANT
GOWN STRL REUS W/ TWL XL LVL3 (GOWN DISPOSABLE) IMPLANT
GOWN STRL REUS W/TWL LRG LVL3 (GOWN DISPOSABLE) ×6
GOWN STRL REUS W/TWL XL LVL3 (GOWN DISPOSABLE) ×3
ILLUMINATOR WAVEGUIDE N/F (MISCELLANEOUS) IMPLANT
KIT MARKER MARGIN INK (KITS) ×3 IMPLANT
LIGHT WAVEGUIDE WIDE FLAT (MISCELLANEOUS) IMPLANT
LIQUID BAND (GAUZE/BANDAGES/DRESSINGS) ×3 IMPLANT
MARKER SKIN DUAL TIP RULER LAB (MISCELLANEOUS) ×3 IMPLANT
NDL HYPO 25X1 1.5 SAFETY (NEEDLE) ×1 IMPLANT
NEEDLE HYPO 25X1 1.5 SAFETY (NEEDLE) ×3 IMPLANT
NS IRRIG 1000ML POUR BTL (IV SOLUTION) ×2 IMPLANT
PACK BASIN DAY SURGERY FS (CUSTOM PROCEDURE TRAY) ×3 IMPLANT
PENCIL BUTTON HOLSTER BLD 10FT (ELECTRODE) ×3 IMPLANT
SHEET MEDIUM DRAPE 40X70 STRL (DRAPES) IMPLANT
SLEEVE SCD COMPRESS KNEE MED (MISCELLANEOUS) ×3 IMPLANT
SPONGE GAUZE 4X4 12PLY STER LF (GAUZE/BANDAGES/DRESSINGS) IMPLANT
SPONGE LAP 4X18 X RAY DECT (DISPOSABLE) ×3 IMPLANT
STAPLER VISISTAT 35W (STAPLE) IMPLANT
STRIP CLOSURE SKIN 1/2X4 (GAUZE/BANDAGES/DRESSINGS) ×2 IMPLANT
SUT MNCRL AB 4-0 PS2 18 (SUTURE) ×2 IMPLANT
SUT MON AB 5-0 PS2 18 (SUTURE) IMPLANT
SUT SILK 2 0 SH (SUTURE) IMPLANT
SUT VIC AB 2-0 SH 27 (SUTURE) ×3
SUT VIC AB 2-0 SH 27XBRD (SUTURE) ×1 IMPLANT
SUT VIC AB 3-0 SH 27 (SUTURE) ×3
SUT VIC AB 3-0 SH 27X BRD (SUTURE) ×1 IMPLANT
SUT VIC AB 5-0 PS2 18 (SUTURE) IMPLANT
SYR CONTROL 10ML LL (SYRINGE) ×3 IMPLANT
TOWEL OR 17X24 6PK STRL BLUE (TOWEL DISPOSABLE) ×3 IMPLANT
TOWEL OR NON WOVEN STRL DISP B (DISPOSABLE) ×3 IMPLANT
TUBE CONNECTING 20'X1/4 (TUBING) ×1
TUBE CONNECTING 20X1/4 (TUBING) ×1 IMPLANT
YANKAUER SUCT BULB TIP NO VENT (SUCTIONS) ×2 IMPLANT

## 2014-10-31 NOTE — Anesthesia Preprocedure Evaluation (Signed)
Anesthesia Evaluation    Airway Mallampati: I  TM Distance: >3 FB Neck ROM: Full    Dental  (+) Teeth Intact, Dental Advisory Given   Pulmonary COPD,  COPD inhaler, former smoker,    breath sounds clear to auscultation       Cardiovascular hypertension,  Rhythm:Regular Rate:Normal     Neuro/Psych    GI/Hepatic   Endo/Other    Renal/GU      Musculoskeletal   Abdominal   Peds  Hematology   Anesthesia Other Findings   Reproductive/Obstetrics                             Anesthesia Physical Anesthesia Plan  ASA: III  Anesthesia Plan: General   Post-op Pain Management:    Induction: Intravenous  Airway Management Planned:   Additional Equipment:   Intra-op Plan:   Post-operative Plan: Extubation in OR  Informed Consent: I have reviewed the patients History and Physical, chart, labs and discussed the procedure including the risks, benefits and alternatives for the proposed anesthesia with the patient or authorized representative who has indicated his/her understanding and acceptance.   Dental advisory given  Plan Discussed with: CRNA, Anesthesiologist and Surgeon  Anesthesia Plan Comments:         Anesthesia Quick Evaluation

## 2014-10-31 NOTE — H&P (Signed)
  74 yof who has history of benign breast lesions excised on both sides with fh in a niece presents with 9 mm calcs found on mm on left side. density is c. stereo biopsy shows a intraductal papiloma with fa. this is concordant. she is referred for evaluation. she does not have any complaints referable to either breast. no masses or discharge.    Other Problems  Anxiety Disorder Cervical Cancer Chronic Obstructive Lung Disease Depression Gastric Ulcer Gastroesophageal Reflux Disease High blood pressure Hypercholesterolemia  Past Surgical History  Cataract Surgery Right.  Allergies  No Known Drug Allergies09/13/2016  Medication History  Omeprazole (20MG  Capsule DR, Oral) Active. Wellbutrin XL (150MG  Tablet ER 24HR, Oral) Active. Carboxymethylcellulose Sodium (0.5% Solution, Ophthalmic) Active. Vitamin D (1000UNIT Tablet, Oral) Active. Flexeril (10MG  Tablet, Oral) Active. Myrbetriq (25MG  Tablet ER 24HR, Oral) Active. Multiple Vitamin (Oral) Active. Nitrostat (0.4MG  Tab Sublingual, Sublingual) Active. Zoloft (100MG  Tablet, Oral) Active. Vitamin C (500MG  Capsule, Oral) Active. Medications Reconciled  Social History  Caffeine use Coffee. No drug use Tobacco use Former smoker.  Family History  Alcohol Abuse Father, Sister. Arthritis Mother. Cerebrovascular Accident Mother. Diabetes Mellitus Mother, Sister. Heart Disease Mother, Sister.  Pregnancy / Birth History  Age of menopause <45 Gravida 3 Irregular periods Maternal age 52-20 Para 3  Review of Systems General Present- Weight Gain. Not Present- Appetite Loss, Chills, Fatigue, Fever, Night Sweats and Weight Loss. HEENT Present- Ringing in the Ears and Seasonal Allergies. Not Present- Earache, Hearing Loss, Hoarseness, Nose Bleed, Oral Ulcers, Sinus Pain, Sore Throat, Visual Disturbances, Wears glasses/contact lenses and Yellow Eyes. Respiratory Present- Difficulty Breathing and  Wheezing. Not Present- Bloody sputum, Chronic Cough and Snoring. Breast Present- Breast Mass. Not Present- Breast Pain, Nipple Discharge and Skin Changes. Gastrointestinal Present- Excessive gas. Not Present- Abdominal Pain, Bloating, Bloody Stool, Change in Bowel Habits, Chronic diarrhea, Constipation, Difficulty Swallowing, Gets full quickly at meals, Hemorrhoids, Indigestion, Nausea, Rectal Pain and Vomiting. Female Genitourinary Present- Frequency and Urgency. Not Present- Nocturia, Painful Urination and Pelvic Pain. Musculoskeletal Present- Back Pain, Joint Stiffness and Swelling of Extremities. Not Present- Joint Pain, Muscle Pain and Muscle Weakness. Psychiatric Present- Bipolar and Depression. Not Present- Anxiety, Change in Sleep Pattern, Fearful and Frequent crying.  Vitals  Weight: 173 lb Height: 64in Body Surface Area: 1.88 m Body Mass Index: 29.7 kg/m  Temp.: 97.10F(Temporal)  Pulse: 85 (Regular)  BP: 128/76 (Sitting, Left Arm, Standard)  Physical Exam  General Mental Status-Alert. Orientation-Oriented X3. Chest and Lung Exam Chest and lung exam reveals -on auscultation, normal breath sounds, no adventitious sounds and normal vocal resonance. Breast Nipples-No Discharge. Breast Lump-No Palpable Breast Mass. Cardiovascular Cardiovascular examination reveals -normal heart sounds, regular rate and rhythm with no murmurs. Lymphatic Head & Neck General Head & Neck Lymphatics: Bilateral - Description - Normal. Axillary General Axillary Region: Bilateral - Description - Normal. Note: no  adenopathy  Assessment & Plan  PAPILLOMA OF BREAST (D24.9) Story: Left breast seed guided excisional biopsy we discussed observation vs surgery. she desires surgery. will get cardiology clearance first. then plan for seed guided excision.

## 2014-10-31 NOTE — Op Note (Signed)
Preoperative diagnosis: right breast mass, core c/w papilloma Postoperative diagnosis: same as above Procedure: Right breast radioactive seed guided excisional biopsy Surgeon: Dr Serita Grammes Anesthesia: general EBL: minimal Drains none Complications none Specimen rigth breast tissue marked with paint Sponge and needle count was correct to completion Suspicion to recovery stable  Indications: This is 31 yof with a right breast mass on mm. This underwent core biopsy and is likely a papilloma  We discussed rsl excision of the mass vs observation and she desired excision. The seed was placed prior to beginning. I had the mamograms in the OR  Procedure: After informed consent was obtained the patient was taken to the operating room. She was given antibiotics. Sequential compression devices were on her legs. She was then placed under general anesthesia without complication. She was prepped and draped in the standard sterile surgical fashion. A surgical timeout was then performed.  I infiltrated quarter percent Marcaine overlying the seed in the superior breast made an incision. I then used the neoprobe to identify the radioactive seed and then removed this.Hemostasis was observed. I painted the specimen. A mammogram was taken confirming removal of the clip and the seed.  The clip was not immediately adjacent to the seed and I removed an area that felt like hematoma cavity which contained the clip These were both on a margin but this is down on the muscle and benign lesion on core. There was no radioactivity remaining in the breast. I then closed this with 2-0 Vicryl, 3-0 Vicryl, and 4-0 Monocryl. Glue was placed over the wound. A breast binder was placed. She tolerated this well was extubated and transferred to recovery in stable condition

## 2014-10-31 NOTE — Anesthesia Postprocedure Evaluation (Signed)
  Anesthesia Post-op Note  Patient: Sherri King  Procedure(s) Performed: Procedure(s): RADIOACTIVE SEED GUIDED EXCISIONAL LEFT BREAST BIOPSY (Left)  Patient Location: PACU  Anesthesia Type: General   Level of Consciousness: awake, alert  and oriented  Airway and Oxygen Therapy: Patient Spontanous Breathing  Post-op Pain: mild  Post-op Assessment: Post-op Vital signs reviewed  Post-op Vital Signs: Reviewed  Last Vitals:  Filed Vitals:   10/31/14 1430  BP: 151/81  Pulse: 79  Temp:   Resp: 17    Complications: No apparent anesthesia complications

## 2014-10-31 NOTE — Discharge Instructions (Signed)
[facility] °Office Phone Number [phone] ° °POST OP INSTRUCTIONS ° °Always review your discharge instruction sheet given to you by the facility where your surgery was performed. ° °IF YOU HAVE DISABILITY OR FAMILY LEAVE FORMS, YOU MUST BRING THEM TO THE OFFICE FOR PROCESSING.  DO NOT GIVE THEM TO YOUR DOCTOR. ° °1. A prescription for pain medication may be given to you upon discharge.  Take your pain medication as prescribed, if needed.  If narcotic pain medicine is not needed, then you may take acetaminophen (Tylenol), naprosyn (Alleve) or ibuprofen (Advil) as needed. °2. Take your usually prescribed medications unless otherwise directed °3. If you need a refill on your pain medication, please contact your pharmacy.  They will contact our office to request authorization.  Prescriptions will not be filled after 5pm or on week-ends. °4. You should eat very light the first 24 hours after surgery, such as soup, crackers, pudding, etc.  Resume your normal diet the day after surgery. °5. Most patients will experience some swelling and bruising in the breast.  Ice packs and a good support bra will help.  Wear the breast binder provided or a sports bra for 72 hours day and night.  After that wear a sports bra during the day until you return to the office. Swelling and bruising can take several days to resolve.  °6. It is common to experience some constipation if taking pain medication after surgery.  Increasing fluid intake and taking a stool softener will usually help or prevent this problem from occurring.  A mild laxative (Milk of Magnesia or Miralax) should be taken according to package directions if there are no bowel movements after 48 hours. °7. Unless discharge instructions indicate otherwise, you may remove your bandages 48 hours after surgery and you may shower at that time.  You may have steri-strips (small skin tapes) in place directly over the incision.  These strips should be left on the  skin for 7-10 days and will come off on their own.  If your surgeon used skin glue on the incision, you may shower in 24 hours.  The glue will flake off over the next 2-3 weeks.  Any sutures or staples will be removed at the office during your follow-up visit. °8. ACTIVITIES:  You may resume regular daily activities (gradually increasing) beginning the next day.  Wearing a good support bra or sports bra minimizes pain and swelling.  You may have sexual intercourse when it is comfortable. °a. You may drive when you no longer are taking prescription pain medication, you can comfortably wear a seatbelt, and you can safely maneuver your car and apply brakes. °b. RETURN TO WORK:  ______________________________________________________________________________________ °9. You should see your doctor in the office for a follow-up appointment approximately two weeks after your surgery.  Your doctor’s nurse will typically make your follow-up appointment when she calls you with your pathology report.  Expect your pathology report 3-4 business days after your surgery.  You may call to check if you do not hear from us after three days. °10. OTHER INSTRUCTIONS: _______________________________________________________________________________________________ _____________________________________________________________________________________________________________________________________ °_____________________________________________________________________________________________________________________________________ °_____________________________________________________________________________________________________________________________________ ° °WHEN TO CALL DR WAKEFIELD: °1. Fever over 101.0 °2. Nausea and/or vomiting. °3. Extreme swelling or bruising. °4. Continued bleeding from incision. °5. Increased pain, redness, or drainage from the incision. ° °The clinic staff is available to answer your questions during regular  business hours.  Please don’t hesitate to call and ask to speak to one of the nurses for clinical concerns.  If   you have a medical emergency, go to the nearest emergency room or call 911.  A surgeon from [facility] is always on call at the hospital. ° °For further questions, please visit [email or web address] ° ° ° °Post Anesthesia Home Care Instructions ° °Activity: °Get plenty of rest for the remainder of the day. A responsible adult should stay with you for 24 hours following the procedure.  °For the next 24 hours, DO NOT: °-Drive a car °-Operate machinery °-Drink alcoholic beverages °-Take any medication unless instructed by your physician °-Make any legal decisions or sign important papers. ° °Meals: °Start with liquid foods such as gelatin or soup. Progress to regular foods as tolerated. Avoid greasy, spicy, heavy foods. If nausea and/or vomiting occur, drink only clear liquids until the nausea and/or vomiting subsides. Call your physician if vomiting continues. ° °Special Instructions/Symptoms: °Your throat may feel dry or sore from the anesthesia or the breathing tube placed in your throat during surgery. If this causes discomfort, gargle with warm salt water. The discomfort should disappear within 24 hours. ° °If you had a scopolamine patch placed behind your ear for the management of post- operative nausea and/or vomiting: ° °1. The medication in the patch is effective for 72 hours, after which it should be removed.  Wrap patch in a tissue and discard in the trash. Wash hands thoroughly with soap and water. °2. You may remove the patch earlier than 72 hours if you experience unpleasant side effects which may include dry mouth, dizziness or visual disturbances. °3. Avoid touching the patch. Wash your hands with soap and water after contact with the patch. °  ° °

## 2014-10-31 NOTE — Anesthesia Procedure Notes (Signed)
Procedure Name: LMA Insertion Date/Time: 10/31/2014 1:28 PM Performed by: Toula Moos L Pre-anesthesia Checklist: Patient identified, Emergency Drugs available, Suction available, Patient being monitored and Timeout performed Patient Re-evaluated:Patient Re-evaluated prior to inductionOxygen Delivery Method: Circle System Utilized Preoxygenation: Pre-oxygenation with 100% oxygen Intubation Type: IV induction Ventilation: Mask ventilation without difficulty LMA: LMA inserted LMA Size: 4.0 Number of attempts: 1 Airway Equipment and Method: Bite block Placement Confirmation: positive ETCO2 Tube secured with: Tape Dental Injury: Teeth and Oropharynx as per pre-operative assessment

## 2014-10-31 NOTE — Transfer of Care (Signed)
Immediate Anesthesia Transfer of Care Note  Patient: Sherri King  Procedure(s) Performed: Procedure(s): RADIOACTIVE SEED GUIDED EXCISIONAL LEFT BREAST BIOPSY (Left)  Patient Location: PACU  Anesthesia Type:General  Level of Consciousness: awake and patient cooperative  Airway & Oxygen Therapy: Patient Spontanous Breathing and Patient connected to face mask oxygen  Post-op Assessment: Report given to RN and Post -op Vital signs reviewed and stable  Post vital signs: Reviewed and stable  Last Vitals:  Filed Vitals:   10/31/14 1410  BP:   Pulse: 80  Temp:   Resp: 16    Complications: No apparent anesthesia complications

## 2014-10-31 NOTE — Interval H&P Note (Signed)
History and Physical Interval Note:  10/31/2014 1:13 PM  Sherri King  has presented today for surgery, with the diagnosis of LEFT BREAST PAPILLOMA  The various methods of treatment have been discussed with the patient and family. After consideration of risks, benefits and other options for treatment, the patient has consented to  Procedure(s): RADIOACTIVE SEED GUIDED EXCISIONAL LEFT BREAST BIOPSY (Left) as a surgical intervention .  The patient's history has been reviewed, patient examined, no change in status, stable for surgery.  I have reviewed the patient's chart and labs.  Questions were answered to the patient's satisfaction.     Elley Harp

## 2014-11-01 ENCOUNTER — Encounter (HOSPITAL_BASED_OUTPATIENT_CLINIC_OR_DEPARTMENT_OTHER): Payer: Self-pay | Admitting: General Surgery

## 2014-12-26 ENCOUNTER — Other Ambulatory Visit: Payer: Self-pay | Admitting: Internal Medicine

## 2014-12-26 DIAGNOSIS — M545 Low back pain: Secondary | ICD-10-CM

## 2015-01-22 ENCOUNTER — Ambulatory Visit
Admission: RE | Admit: 2015-01-22 | Discharge: 2015-01-22 | Disposition: A | Discharge status: Home / Self Care | Payer: Medicare Other | Source: Ambulatory Visit | Attending: Internal Medicine | Admitting: Internal Medicine

## 2015-01-22 DIAGNOSIS — M545 Low back pain: Secondary | ICD-10-CM

## 2015-02-06 ENCOUNTER — Telehealth (HOSPITAL_COMMUNITY): Payer: Self-pay

## 2015-02-06 NOTE — Telephone Encounter (Signed)
Informed pt that Dr. Estanislado Pandy took a look at her MRI and has advised her to f/u with PCP. He is unable to treat back and suggests talking with PCP for treatment. Pt agreed with this plan. AW

## 2015-04-10 ENCOUNTER — Other Ambulatory Visit: Payer: Self-pay | Admitting: Internal Medicine

## 2015-04-10 DIAGNOSIS — R748 Abnormal levels of other serum enzymes: Secondary | ICD-10-CM

## 2015-04-18 ENCOUNTER — Ambulatory Visit
Admission: RE | Admit: 2015-04-18 | Discharge: 2015-04-18 | Disposition: A | Discharge status: Home / Self Care | Payer: Medicare Other | Source: Ambulatory Visit | Attending: Internal Medicine | Admitting: Internal Medicine

## 2015-04-18 DIAGNOSIS — R748 Abnormal levels of other serum enzymes: Secondary | ICD-10-CM

## 2015-05-06 ENCOUNTER — Other Ambulatory Visit: Payer: Self-pay | Admitting: Internal Medicine

## 2015-05-06 ENCOUNTER — Encounter: Payer: Self-pay | Admitting: *Deleted

## 2015-05-06 DIAGNOSIS — R0600 Dyspnea, unspecified: Secondary | ICD-10-CM

## 2015-05-07 ENCOUNTER — Ambulatory Visit (INDEPENDENT_AMBULATORY_CARE_PROVIDER_SITE_OTHER): Payer: Medicare Other | Admitting: Pulmonary Disease

## 2015-05-07 ENCOUNTER — Encounter: Payer: Self-pay | Admitting: Pulmonary Disease

## 2015-05-07 VITALS — BP 134/64 | HR 70 | Ht 63.5 in | Wt 169.0 lb

## 2015-05-07 DIAGNOSIS — R0600 Dyspnea, unspecified: Secondary | ICD-10-CM

## 2015-05-07 DIAGNOSIS — J439 Emphysema, unspecified: Secondary | ICD-10-CM

## 2015-05-07 LAB — PULMONARY FUNCTION TEST
DL/VA % pred: 79 %
DL/VA: 3.76 ml/min/mmHg/L
DLCO COR: 14.84 ml/min/mmHg
DLCO UNC % PRED: 62 %
DLCO UNC: 14.8 ml/min/mmHg
DLCO cor % pred: 62 %
FEF 25-75 PRE: 0.65 L/s
FEF 25-75 Post: 1.1 L/sec
FEF2575-%Change-Post: 70 %
FEF2575-%PRED-PRE: 38 %
FEF2575-%Pred-Post: 65 %
FEV1-%Change-Post: 19 %
FEV1-%PRED-PRE: 58 %
FEV1-%Pred-Post: 70 %
FEV1-POST: 1.47 L
FEV1-Pre: 1.23 L
FEV1FVC-%CHANGE-POST: 2 %
FEV1FVC-%Pred-Pre: 82 %
FEV6-%CHANGE-POST: 16 %
FEV6-%Pred-Post: 85 %
FEV6-%Pred-Pre: 73 %
FEV6-POST: 2.27 L
FEV6-Pre: 1.95 L
FEV6FVC-%Change-Post: 0 %
FEV6FVC-%PRED-POST: 104 %
FEV6FVC-%Pred-Pre: 104 %
FVC-%Change-Post: 16 %
FVC-%Pred-Post: 82 %
FVC-%Pred-Pre: 70 %
FVC-Post: 2.31 L
FVC-Pre: 1.98 L
POST FEV6/FVC RATIO: 99 %
PRE FEV1/FVC RATIO: 62 %
Post FEV1/FVC ratio: 64 %
Pre FEV6/FVC Ratio: 99 %
RV % pred: 135 %
RV: 3.03 L
TLC % PRED: 104 %
TLC: 5.22 L

## 2015-05-07 NOTE — Progress Notes (Signed)
PFT done today. 

## 2015-05-07 NOTE — Patient Instructions (Signed)
Pulmonary function tests were reviewed with you today. They show moderate emphysema. We will start her on a Symbicort inhaler.  Return to clinic in 1-2 months to assess response to therapy.

## 2015-05-07 NOTE — Progress Notes (Signed)
   Subjective:    Patient ID: Sherri King, female    DOB: 22-Jul-1940, 75 y.o.   MRN: [id number]  HPI Consult for evaluation of dyspnea.  Mrs. Donn is a 75 year old with extensive smoking history. She complains of dyspnea on exertion for several years. She's noticed a worsening of her symptoms for the past 3 months. The symptoms are associated with daily cough, white sputum production. She had PFTs today which showed moderate obstructive defect with a bronchodilator response. She is on albuterol rescue inhaler but uses this very rarely. She had been told several years ago that she has asthma. She denies any seasonal allergies, rhinitis, postnasal drip, GERD.  DATA: PFTs 05/07/15 FVC 1.98 [70%) FEV1 1.23 (50%), post-BD FEV1 1.47 [70%], +19% change F/F 62 TLC 104% RV/TLC 120% DLCO 62%. Moderate-severe obstruction, positive bronchodilator response. Moderate reduction in diffusion capacity.  CXR 12/22/09 1 cm rounded opacity at left lung apex. Images reviewed.  Social History: 60-pack-year smoking history. Quit in 2016. No alcohol or drug use. 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  Family History: Father-lung cancer. Sister-lung cancer, heart disease Mother- stroke  Review of Systems Dyspnea on exertion, wheezing, cough, white sputum production. No marked assess No fevers, chills, malaise, loss of weight, loss of appetite. No chest pain, palpitation. No nausea, vomiting, diarrhea, constipation. All other review of systems negative.    Objective:   Physical Exam Blood pressure 134/64, pulse 70, height 5' 3.5" (1.613 m), weight 169 lb (76.658 kg), SpO2 95 %. Gen: No apparent distress Neuro: No gross focal deficits. HEENT: No JVD, lymphadenopathy, thyromegaly. RS: Clear, No wheeze or crackles CVS: S1-S2 heard, no murmurs rubs gallops. Abdomen: Soft, positive bowel sounds. Musculoskeletal: No edema.    Assessment & Plan:  Dyspnea on  exertion Moderate COPD I reviewed the PFTs with her. They show moderate obstructive process with a marked bronchodilator response. She is only on albuterol rescue inhaler and will benefit from optimization of her bronchodilators. As there is a diagnosis of asthma from the past I will start her on Symbicort and assess response to therapy.  She's had a chest x-ray in 2011 which showed a left apical rounded opacity, probable bony abnormality. She is already been referred for lung cancer screening by her primary care physician.   Plan: - Start symbicort - Lung cancer referral (already done by PCP).  Marshell Garfinkel MD [facility] Pager [phone] If no answer or after 3pm call: [phone] 05/07/2015, 2:44 PM

## 2015-05-09 ENCOUNTER — Telehealth: Payer: Self-pay | Admitting: Pulmonary Disease

## 2015-05-09 DIAGNOSIS — Z720 Tobacco use: Secondary | ICD-10-CM

## 2015-05-09 NOTE — Telephone Encounter (Signed)
Patient called returning our call. She can be reached at [employer]

## 2015-05-09 NOTE — Telephone Encounter (Signed)
Pt thought that she was told to go to another appt after she saw PM. Pt thinks it would have been @ 3:30pm the same day. She is not sure where it would have been or what it was for.   The only thing I see is her f/u with PM is scheduled for 3:15pm on 6/26. So maybe this is what she is thinking about?  PM please advise if you had recommended something else for the pt. Thanks!

## 2015-05-09 NOTE — Telephone Encounter (Signed)
Called pt and informed her of referral. She voiced understanding and had no further questions. Referral placed. Nothing further needed.

## 2015-05-09 NOTE — Telephone Encounter (Signed)
lmtcb x1 for pt. 

## 2015-05-09 NOTE — Telephone Encounter (Signed)
She needs to be set up for lung cancer screening. I thought that was her appointment set up by her PCP after she saw me.   If this not not done yet then please send to [facility] to be set up for screening CTs of chest.

## 2015-06-30 ENCOUNTER — Encounter: Payer: Self-pay | Admitting: Pulmonary Disease

## 2015-06-30 ENCOUNTER — Ambulatory Visit (INDEPENDENT_AMBULATORY_CARE_PROVIDER_SITE_OTHER): Payer: Medicare Other | Admitting: Pulmonary Disease

## 2015-06-30 ENCOUNTER — Other Ambulatory Visit: Payer: Self-pay | Admitting: Acute Care

## 2015-06-30 VITALS — BP 122/74 | HR 65 | Ht 64.0 in | Wt 168.8 lb

## 2015-06-30 DIAGNOSIS — R0600 Dyspnea, unspecified: Secondary | ICD-10-CM

## 2015-06-30 DIAGNOSIS — J439 Emphysema, unspecified: Secondary | ICD-10-CM

## 2015-06-30 DIAGNOSIS — Z87891 Personal history of nicotine dependence: Secondary | ICD-10-CM

## 2015-06-30 NOTE — Patient Instructions (Signed)
Continue using the Symbicort as prescribed. We will refer you for an lung cancer screening program.  Return to clinic in 6 months.

## 2015-06-30 NOTE — Progress Notes (Signed)
   Subjective:    Patient ID: Sherri King, female    DOB: Apr 27, 1940, 75 y.o.   MRN: [id number]  PROBLEM LIST: Moderate COPD Heavy ex smoker  HPI Sherri King is a 75 year old with extensive smoking history. She complains of dyspnea on exertion for several years. She's noticed a worsening of her symptoms for the past 3 months. The symptoms are associated with daily cough, white sputum production. She had PFTs today which showed moderate obstructive defect with a bronchodilator response. She is on albuterol rescue inhaler but uses this very rarely. She had been told several years ago that she has asthma. She denies any seasonal allergies, rhinitis, postnasal drip, GERD.  DATA: PFTs 05/07/15 FVC 1.98 [70%) FEV1 1.23 (50%), post-BD FEV1 1.47 [70%], +19% change F/F 62 TLC 104% RV/TLC 120% DLCO 62%. Moderate-severe obstruction, positive bronchodilator response. Moderate reduction in diffusion capacity.  CXR 12/22/09 1 cm rounded opacity at left lung apex. Images reviewed.  Social History: 60-pack-year smoking history. Quit in 2016. No alcohol or drug use. 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  Family History: Father-lung cancer. Sister-lung cancer, heart disease Mother- stroke  Review of Systems Dyspnea on exertion, wheezing, cough, white sputum production. No marked assess No fevers, chills, malaise, loss of weight, loss of appetite. No chest pain, palpitation. No nausea, vomiting, diarrhea, constipation. All other review of systems negative.    Objective:   Physical Exam Blood pressure 122/74, pulse 65, height 5\' 4"  (1.626 m), weight 168 lb 12.8 oz (76.567 kg), SpO2 97 %. Gen: No apparent distress Neuro: No gross focal deficits. HEENT: No JVD, lymphadenopathy, thyromegaly. RS: Clear, No wheeze or crackles CVS: S1-S2 heard, no murmurs rubs gallops. Abdomen: Soft, positive bowel sounds. Musculoskeletal: No edema.    Assessment &  Plan:  Dyspnea on exertion Moderate COPD Her PFTs shows moderate obstructive process with a marked bronchodilator response. She may have a component of asthma as well. She was started on symbicort last visit and has noticed an improvement in symptoms.   She's had a chest x-ray in 2011 which showed a left apical rounded opacity, probable bony abnormality. She will be referred for lung cancer screening program which will also serve as a follow up imaging of the chest.  Plan: - Continue symbicort - Lung cancer referral   Sherri Garfinkel MD [facility] Pager [phone] If no answer or after 3pm call: [phone] 06/30/2015, 3:38 PM

## 2015-07-10 ENCOUNTER — Emergency Department (HOSPITAL_COMMUNITY)
Admission: EM | Admit: 2015-07-10 | Discharge: 2015-07-10 | Disposition: A | Discharge status: Home / Self Care | Payer: Medicare Other | Attending: Emergency Medicine | Admitting: Emergency Medicine

## 2015-07-10 ENCOUNTER — Encounter (HOSPITAL_COMMUNITY): Payer: Self-pay | Admitting: Emergency Medicine

## 2015-07-10 DIAGNOSIS — I10 Essential (primary) hypertension: Secondary | ICD-10-CM | POA: Diagnosis not present

## 2015-07-10 DIAGNOSIS — Y929 Unspecified place or not applicable: Secondary | ICD-10-CM | POA: Diagnosis not present

## 2015-07-10 DIAGNOSIS — S70362A Insect bite (nonvenomous), left thigh, initial encounter: Secondary | ICD-10-CM | POA: Diagnosis present

## 2015-07-10 DIAGNOSIS — J449 Chronic obstructive pulmonary disease, unspecified: Secondary | ICD-10-CM | POA: Diagnosis not present

## 2015-07-10 DIAGNOSIS — W57XXXA Bitten or stung by nonvenomous insect and other nonvenomous arthropods, initial encounter: Secondary | ICD-10-CM | POA: Diagnosis not present

## 2015-07-10 DIAGNOSIS — R21 Rash and other nonspecific skin eruption: Secondary | ICD-10-CM | POA: Diagnosis not present

## 2015-07-10 DIAGNOSIS — Z8541 Personal history of malignant neoplasm of cervix uteri: Secondary | ICD-10-CM | POA: Diagnosis not present

## 2015-07-10 DIAGNOSIS — Z87891 Personal history of nicotine dependence: Secondary | ICD-10-CM | POA: Diagnosis not present

## 2015-07-10 DIAGNOSIS — Z79899 Other long term (current) drug therapy: Secondary | ICD-10-CM | POA: Insufficient documentation

## 2015-07-10 DIAGNOSIS — Y999 Unspecified external cause status: Secondary | ICD-10-CM | POA: Insufficient documentation

## 2015-07-10 DIAGNOSIS — Y939 Activity, unspecified: Secondary | ICD-10-CM | POA: Insufficient documentation

## 2015-07-10 LAB — CBC WITH DIFFERENTIAL/PLATELET
BASOS ABS: 0 10*3/uL (ref 0.0–0.1)
Basophils Relative: 0 %
EOS PCT: 4 %
Eosinophils Absolute: 0.3 10*3/uL (ref 0.0–0.7)
HEMATOCRIT: 44.4 % (ref 36.0–46.0)
HEMOGLOBIN: 14.2 g/dL (ref 12.0–15.0)
LYMPHS ABS: 2.7 10*3/uL (ref 0.7–4.0)
LYMPHS PCT: 31 %
MCH: 30.5 pg (ref 26.0–34.0)
MCHC: 32 g/dL (ref 30.0–36.0)
MCV: 95.3 fL (ref 78.0–100.0)
MONO ABS: 1 10*3/uL (ref 0.1–1.0)
Monocytes Relative: 11 %
NEUTROS ABS: 4.9 10*3/uL (ref 1.7–7.7)
Neutrophils Relative %: 54 %
Platelets: 203 10*3/uL (ref 150–400)
RBC: 4.66 MIL/uL (ref 3.87–5.11)
RDW: 13.2 % (ref 11.5–15.5)
WBC: 9 10*3/uL (ref 4.0–10.5)

## 2015-07-10 MED ORDER — DOXYCYCLINE HYCLATE 100 MG PO CAPS: 100.0000 mg | ORAL_CAPSULE | Freq: Two times a day (BID) | ORAL | Status: DC

## 2015-07-10 NOTE — ED Notes (Signed)
Pt understood dc material. NAD noted. Script and paperwork given at [employer]

## 2015-07-10 NOTE — ED Provider Notes (Signed)
CSN: [id number]     Arrival date & time 07/10/15  0325 History  By signing my name below, I, Hansel Feinstein, attest that this documentation has been prepared under the direction and in the presence of Merryl Hacker, MD. Electronically Signed: Hansel Feinstein, ED Scribe. 07/10/2015. 3:39 AM.    Chief Complaint  Patient presents with  . Rash   The history is provided by the patient. No language interpreter was used.    HPI Comments: Sherri King is a 75 y.o. female who presents to the Emergency Department complaining of a pruritic rash to the right ankle onset 5 days ago. Pt states h/o similar rashes with exposure to Clorox, which she washed her socks with in the last week. Pt also notes that her rash is painful with palpation, which is not typical of a rash she would experience with exposure to Clorox. She reports that she has been applying alcohol to the rash with no relief of symptoms. Pt also reports she noticed a tick on her left thigh last night, which she has not yet removed. She then became concerned that the rash may be related to the tick. Pt denies known poison ivy exposure. She also denies fever, rash to her palms or soles of her feet.  Past Medical History  Diagnosis Date  . Coronary vasospasm ([place])   . Hypertension   . Hyperlipidemia   . Anxiety   . Cancer ([facility])     cervical  . Arthritis   . Multiple thyroid nodules   . COPD (chronic obstructive pulmonary disease) ([place])   . Depression   . GERD (gastroesophageal reflux disease)    Past Surgical History  Procedure Laterality Date  . Vesicovaginal fistula closure w/ tah    . Abdominal hysterectomy      due to cancer  . Breast surgery Bilateral 2016    removal of several nodules from right breast  . Tubal ligation    . Thyroidectomy, partial    . Radioactive seed guided excisional breast biopsy Left 10/31/2014    Procedure: RADIOACTIVE SEED GUIDED EXCISIONAL LEFT BREAST BIOPSY;  Surgeon: Rolm Bookbinder, MD;   Location: [facility];  Service: General;  Laterality: Left;   Family History  Problem Relation Age of Onset  . Heart failure Mother   . Stroke Mother   . Cancer Father     lung  . Cancer Sister     lung, mets to brain  . Cancer Sister     lung  . Heart disease Sister   . Allergies Sister     all 8 sisters  . Cancer      bone   Social History  Substance Use Topics  . Smoking status: Former Smoker -- 1.00 packs/day for 60 years    Types: Cigarettes    Quit date: 04/28/2014  . Smokeless tobacco: None  . Alcohol Use: 0.0 oz/week    0 Standard drinks or equivalent per week     Comment: occasional beer   OB History    No data available     Review of Systems  Constitutional: Negative for fever.  Skin: Positive for rash.  All other systems reviewed and are negative.   Allergies  Bee venom  Home Medications   Prior to Admission medications   Medication Sig Start Date End Date Taking? Authorizing Provider  acetaminophen (TYLENOL) 500 MG tablet Take 500 mg by mouth every 6 (six) hours as needed.    Historical Provider, MD  albuterol (PROVENTIL HFA;VENTOLIN HFA) 108 (90 BASE) MCG/ACT inhaler Inhale into the lungs every 6 (six) hours as needed for wheezing or shortness of breath.    Historical Provider, MD  albuterol (PROVENTIL) (2.5 MG/3ML) 0.083% nebulizer solution Take 2.5 mg by nebulization every 6 (six) hours as needed for wheezing or shortness of breath.    Historical Provider, MD  B Complex Vitamins (B COMPLEX-B12) TABS Take 1 tablet by mouth daily.    Historical Provider, MD  buPROPion (WELLBUTRIN SR) 150 MG 12 hr tablet Take 150 mg by mouth 2 (two) times daily.    Historical Provider, MD  carboxymethylcellulose (REFRESH PLUS) 0.5 % SOLN Place 1 drop into both eyes daily.    Historical Provider, MD  cholecalciferol (VITAMIN D) 1000 UNITS tablet Take 1,000 Units by mouth daily.     Historical Provider, MD  cyclobenzaprine (FLEXERIL) 10 MG tablet Take 10 mg  by mouth at bedtime.    Historical Provider, MD  doxycycline (VIBRAMYCIN) 100 MG capsule Take 1 capsule (100 mg total) by mouth 2 (two) times daily. 07/10/15   Merryl Hacker, MD  FORTEO 600 MCG/2.4ML SOLN Inject once daily 04/13/15   Historical Provider, MD  HYDROcodone-acetaminophen (NORCO) 10-325 MG tablet Take 1 tablet by mouth every 6 (six) hours as needed. 10/31/14 10/31/15  Rolm Bookbinder, MD  mirabegron ER (MYRBETRIQ) 25 MG TB24 tablet Take 25 mg by mouth daily.    Historical Provider, MD  Multiple Vitamins-Minerals (MULTIVITAMIN WITH MINERALS) tablet Take 1 tablet by mouth daily.    Historical Provider, MD  nitroGLYCERIN (NITROSTAT) 0.4 MG SL tablet Place 1 tablet (0.4 mg total) under the tongue every 5 (five) minutes as needed for chest pain. 05/04/13   Burtis Junes, NP  omeprazole (PRILOSEC) 20 MG capsule Take 20 mg by mouth daily.    Historical Provider, MD  sertraline (ZOLOFT) 100 MG tablet Take 100 mg by mouth daily.    Historical Provider, MD  traMADol (ULTRAM) 50 MG tablet Take 1 tablet by mouth every 6 (six) hours as needed. 03/03/15   Historical Provider, MD  vitamin C (ASCORBIC ACID) 500 MG tablet Take 500 mg by mouth 2 (two) times daily.    Historical Provider, MD   BP 165/77 mmHg  Pulse 79  Temp(Src) 97.7 F (36.5 C) (Oral)  Resp 20  SpO2 94% Physical Exam  Constitutional: She is oriented to person, place, and time. She appears well-developed and well-nourished.  HENT:  Head: Normocephalic and atraumatic.  Cardiovascular: Normal rate, regular rhythm and normal heart sounds.   Pulmonary/Chest: Effort normal and breath sounds normal. No respiratory distress. She has no wheezes.  Abdominal: Soft. There is no tenderness.  Neurological: She is alert and oriented to person, place, and time.  Skin: Skin is warm and dry.  Slightly raised nonblanching rash noted over the anterior and medial aspect of the right ankle, scattered petechiae noted, no significant surrounding  erythema, no rash noted over the palms or soles Small bite noted over the anterior aspect of the left thigh, no retained tick noted  Psychiatric: She has a normal mood and affect.  Nursing note and vitals reviewed.   ED Course  Procedures (including critical care time) DIAGNOSTIC STUDIES: Oxygen Saturation is 97% on RA, normal by my interpretation.    COORDINATION OF CARE: 3:35 AM Discussed treatment plan with pt at bedside which includes lab work and pt agreed to plan.   Labs Review Labs Reviewed  CBC WITH DIFFERENTIAL/PLATELET    Imaging Review  No results found. I have personally reviewed and evaluated these images and lab results as part of my medical decision-making.   EKG Interpretation None      MDM   Final diagnoses:  Rash  Tick bite    Patient presents with a rash which she initially attributed to exposure to Clorox; however, she found a tick and became concerned. She denies any systemic illness. She is well-appearing and afebrile. Rash appears vasculitic and has petechiae. It is limited and isolated to the right ankle.  No rash involving the palmar dorsal. Screening CBC obtained with a normal platelet count. While it is unclear the etiology of the rash, given tick exposure and petechial nature of the rash, will cover for tick borne illness with doxycycline.  After history, exam, and medical workup I feel the patient has been appropriately medically screened and is safe for discharge home. Pertinent diagnoses were discussed with the patient. Patient was given return precautions.  I personally performed the services described in this documentation, which was scribed in my presence. The recorded information has been reviewed and is accurate.   Merryl Hacker, MD 07/10/15 [phone]

## 2015-07-10 NOTE — Discharge Instructions (Signed)
You were seen today for a tick bite and a rash. It is hard to determine whether they are related. Your lab work is reassuring. You will be placed on doxycycline to cover for tick borne illness. If you develop worsening rash, fever or any new or worsening symptoms she should be reevaluated.  Tick Bite Information Ticks are insects that attach themselves to the skin and draw blood for food. There are various types of ticks. Common types include wood ticks and deer ticks. Most ticks live in shrubs and grassy areas. Ticks can climb onto your body when you make contact with leaves or grass where the tick is waiting. The most common places on the body for ticks to attach themselves are the scalp, neck, armpits, waist, and groin. Most tick bites are harmless, but sometimes ticks carry germs that cause diseases. These germs can be spread to a person during the tick's feeding process. The chance of a disease spreading through a tick bite depends on:   The type of tick.  Time of year.   How long the tick is attached.   Geographic location.  HOW CAN YOU PREVENT TICK BITES? Take these steps to help prevent tick bites when you are outdoors:  Wear protective clothing. Long sleeves and long pants are best.   Wear white clothes so you can see ticks more easily.  Tuck your pant legs into your socks.   If walking on a trail, stay in the middle of the trail to avoid brushing against bushes.  Avoid walking through areas with long grass.  Put insect repellent on all exposed skin and along boot tops, pant legs, and sleeve cuffs.   Check clothing, hair, and skin repeatedly and before going inside.   Brush off any ticks that are not attached.  Take a shower or bath as soon as possible after being outdoors.  WHAT IS THE PROPER WAY TO REMOVE A TICK? Ticks should be removed as soon as possible to help prevent diseases caused by tick bites. 1. If latex gloves are available, put them on before trying  to remove a tick.  2. Using fine-point tweezers, grasp the tick as close to the skin as possible. You may also use curved forceps or a tick removal tool. Grasp the tick as close to its head as possible. Avoid grasping the tick on its body. 3. Pull gently with steady upward pressure until the tick lets go. Do not twist the tick or jerk it suddenly. This may break off the tick's head or mouth parts. 4. Do not squeeze or crush the tick's body. This could force disease-carrying fluids from the tick into your body.  5. After the tick is removed, wash the bite area and your hands with soap and water or other disinfectant such as alcohol. 6. Apply a small amount of antiseptic cream or ointment to the bite site.  7. Wash and disinfect any instruments that were used.  Do not try to remove a tick by applying a hot match, petroleum jelly, or fingernail polish to the tick. These methods do not work and may increase the chances of disease being spread from the tick bite.  WHEN SHOULD YOU SEEK MEDICAL CARE? Contact your health care provider if you are unable to remove a tick from your skin or if a part of the tick breaks off and is stuck in the skin.  After a tick bite, you need to be aware of signs and symptoms that could be related  to diseases spread by ticks. Contact your health care provider if you develop any of the following in the days or weeks after the tick bite:  Unexplained fever.  Rash. A circular rash that appears days or weeks after the tick bite may indicate the possibility of Lyme disease. The rash may resemble a target with a bull's-eye and may occur at a different part of your body than the tick bite.  Redness and swelling in the area of the tick bite.   Tender, swollen lymph glands.   Diarrhea.   Weight loss.   Cough.   Fatigue.   Muscle, joint, or bone pain.   Abdominal pain.   Headache.   Lethargy or a change in your level of consciousness.  Difficulty walking  or moving your legs.   Numbness in the legs.   Paralysis.  Shortness of breath.   Confusion.   Repeated vomiting.    This information is not intended to replace advice given to you by your health care provider. Make sure you discuss any questions you have with your health care provider.   Document Released: 12/19/1999 Document Revised: 01/11/2014 Document Reviewed: 05/31/2012 Elsevier Interactive Patient Education [employer].

## 2015-07-10 NOTE — ED Notes (Signed)
Pt arrives with painful rash to R ankle ongoing since Saturday, states she thinks its an allergic reaction to bleach - states she is allergic to each and washed socks in bleach. Also c/o tick bite - worried about "tick fever." RN removed tick in triage.

## 2015-07-17 ENCOUNTER — Other Ambulatory Visit: Payer: Self-pay | Admitting: Acute Care

## 2015-07-17 DIAGNOSIS — Z87891 Personal history of nicotine dependence: Secondary | ICD-10-CM

## 2015-07-21 ENCOUNTER — Inpatient Hospital Stay: Admission: RE | Admit: 2015-07-21 | Payer: Medicare Other | Source: Ambulatory Visit

## 2015-07-21 ENCOUNTER — Encounter: Payer: Medicare Other | Admitting: Acute Care

## 2015-08-21 ENCOUNTER — Encounter: Payer: Medicare Other | Admitting: Acute Care

## 2016-04-29 ENCOUNTER — Telehealth: Payer: Self-pay | Admitting: Cardiology

## 2016-04-29 NOTE — Telephone Encounter (Signed)
Received records from [facility] for appointment on 07/15/16 with Dr Martinique.  Records put with Dr Doug Sou schedule for 07/15/16. lp

## 2016-06-25 ENCOUNTER — Encounter: Payer: Self-pay | Admitting: *Deleted

## 2016-07-13 NOTE — Progress Notes (Signed)
Sherri King Date of Birth: 1940-03-20 Medical Record #[id number]  History of Present Illness: Sherri King is seen for follow up coronary vasospasm.  Last seen 2 years ago. She has a history of  tobacco abuse, vasospastic CAD with last cath in 2009 showing an area of narrowing in the mid LAD that resolved with NTG. She was previously on nitrate therapy but this was stopped over 3 years ago. Other issues include HTN and HLD. When last seen in 2016 she had quit smoking and was off all cardiac meds.   On follow up today she is doing well. States she has rare pressure in her chest but thinks this may be related to her COPD. Notes she got in the habit of eating ice cream and has gained 8 lbs this year. She is fairly inactive.    Current Outpatient Prescriptions  Medication Sig Dispense Refill  . acetaminophen (TYLENOL) 500 MG tablet Take 500 mg by mouth every 6 (six) hours as needed.    Marland Kitchen albuterol (PROVENTIL HFA;VENTOLIN HFA) 108 (90 BASE) MCG/ACT inhaler Inhale into the lungs every 6 (six) hours as needed for wheezing or shortness of breath.    Marland Kitchen albuterol (PROVENTIL) (2.5 MG/3ML) 0.083% nebulizer solution Take 2.5 mg by nebulization every 6 (six) hours as needed for wheezing or shortness of breath.    . B Complex Vitamins (B COMPLEX-B12) TABS Take 1 tablet by mouth daily.    . carboxymethylcellulose (REFRESH PLUS) 0.5 % SOLN Place 1 drop into both eyes daily.    . cholecalciferol (VITAMIN D) 1000 units tablet Take 1,000 Units by mouth daily.    . Multiple Vitamins-Minerals (MULTIVITAMIN WITH MINERALS) tablet Take 1 tablet by mouth daily.    . nitroGLYCERIN (NITROSTAT) 0.4 MG SL tablet Place 1 tablet (0.4 mg total) under the tongue every 5 (five) minutes as needed for chest pain. 25 tablet 3  . traMADol (ULTRAM) 50 MG tablet Take 1 tablet by mouth every 6 (six) hours as needed.  1   No current facility-administered medications for this visit.     Allergies  Allergen Reactions  . Bee  Venom Diarrhea, Nausea And Vomiting and Swelling    Past Medical History:  Diagnosis Date  . Anxiety   . Arthritis   . Cancer ([place])    cervical  . COPD (chronic obstructive pulmonary disease) ([place])   . Coronary vasospasm ([place])   . Depression   . GERD (gastroesophageal reflux disease)   . Hyperlipidemia   . Hypertension   . Multiple thyroid nodules     Past Surgical History:  Procedure Laterality Date  . ABDOMINAL HYSTERECTOMY     due to cancer  . BREAST SURGERY Bilateral 2016   removal of several nodules from right breast  . RADIOACTIVE SEED GUIDED EXCISIONAL BREAST BIOPSY Left 10/31/2014   Procedure: RADIOACTIVE SEED GUIDED EXCISIONAL LEFT BREAST BIOPSY;  Surgeon: Rolm Bookbinder, MD;  Location: [facility];  Service: General;  Laterality: Left;  . THYROIDECTOMY, PARTIAL    . TUBAL LIGATION    . VESICOVAGINAL FISTULA CLOSURE W/ TAH      History  Smoking Status  . Former Smoker  . Packs/day: 1.00  . Years: 60.00  . Types: Cigarettes  . Quit date: 04/28/2014  Smokeless Tobacco  . Never Used    History  Alcohol Use  . 0.0 oz/week    Comment: occasional beer    Family History  Problem Relation Age of Onset  . Heart failure Mother   .  Stroke Mother   . Cancer Father        lung  . Cancer Sister        lung, mets to brain  . Cancer Sister        lung  . Heart disease Sister   . Allergies Sister        all 8 sisters  . Cancer Unknown        bone    Review of Systems: The review of systems is per the HPI. All other systems were reviewed and are negative.  Physical Exam: BP (!) 158/82   Pulse 66   Ht 5\' 4"  (1.626 m)   Wt 176 lb 3.2 oz (79.9 kg)   BMI 30.24 kg/m  Patient is very pleasant and in no acute distress.  Skin is warm and dry. Color is normal.  HEENT is unremarkable. Normocephalic/atraumatic. PERRL. Sclera are nonicteric. Neck is supple. No masses. No JVD. Lungs are clear. Cardiac exam shows a regular rate and rhythm. Normal  S1-2. Abdomen is soft. Extremities no  edema. Gait and ROM are intact. No gross neurologic deficits noted.  Wt Readings from Last 3 Encounters:  07/15/16 176 lb 3.2 oz (79.9 kg)  06/30/15 168 lb 12.8 oz (76.6 kg)  05/07/15 169 lb (76.7 kg)     LABORATORY DATA:  Lab Results  Component Value Date   WBC 9.0 07/10/2015   HGB 14.2 07/10/2015   HCT 44.4 07/10/2015   PLT 203 07/10/2015   GLUCOSE 110 (H) 10/28/2014   NA 141 10/28/2014   K 4.5 10/28/2014   CL 109 10/28/2014   CREATININE 0.52 10/28/2014   BUN 11 10/28/2014   CO2 27 10/28/2014    Dated 04/19/16: cholesterol 158, triglycerides 84, HDL 39, LDL 102. CMET normal.  Ecg today shows NSR with normal Ecg. I have personally reviewed and interpreted this study.   Assessment / Plan:  1. Vasospastic CAD - no clear symptoms reported. She is off her nitrates and doing well.  I think the most important thing is that she quit smoking.  See back in a year.   2. Tobacco abuse - now stopped.  3. HTN - BP is up a little. On no meds. Encourage weight loss and increased aerobic activity.  4. HLD - mildly elevated. Work on lifestyle modification.  See back in a year.

## 2016-07-15 ENCOUNTER — Encounter: Payer: Self-pay | Admitting: Cardiology

## 2016-07-15 ENCOUNTER — Ambulatory Visit (INDEPENDENT_AMBULATORY_CARE_PROVIDER_SITE_OTHER): Payer: Medicare Other | Admitting: Cardiology

## 2016-07-15 VITALS — BP 158/82 | HR 66 | Ht 64.0 in | Wt 176.2 lb

## 2016-07-15 DIAGNOSIS — E7800 Pure hypercholesterolemia, unspecified: Secondary | ICD-10-CM | POA: Diagnosis not present

## 2016-07-15 DIAGNOSIS — I201 Angina pectoris with documented spasm: Secondary | ICD-10-CM | POA: Diagnosis not present

## 2016-07-15 DIAGNOSIS — I10 Essential (primary) hypertension: Secondary | ICD-10-CM

## 2016-07-15 NOTE — Patient Instructions (Signed)
Focus on increased aerobic activity and weight loss  I will see you in one year

## 2016-07-19 NOTE — Addendum Note (Signed)
Addended by: Zebedee Iba on: 07/19/2016 02:18 PM   Modules accepted: Orders

## 2016-10-26 ENCOUNTER — Ambulatory Visit (INDEPENDENT_AMBULATORY_CARE_PROVIDER_SITE_OTHER)
Admission: RE | Admit: 2016-10-26 | Discharge: 2016-10-26 | Disposition: A | Discharge status: Home / Self Care | Payer: Medicare Other | Source: Ambulatory Visit | Attending: Internal Medicine | Admitting: Internal Medicine

## 2016-10-26 ENCOUNTER — Encounter: Payer: Self-pay | Admitting: Internal Medicine

## 2016-10-26 ENCOUNTER — Ambulatory Visit (INDEPENDENT_AMBULATORY_CARE_PROVIDER_SITE_OTHER): Payer: Medicare Other | Admitting: Internal Medicine

## 2016-10-26 VITALS — BP 138/78 | HR 76 | Ht 64.0 in | Wt 177.1 lb

## 2016-10-26 DIAGNOSIS — J449 Chronic obstructive pulmonary disease, unspecified: Secondary | ICD-10-CM | POA: Diagnosis not present

## 2016-10-26 MED ORDER — PREDNISONE 10 MG PO TABS: ORAL_TABLET | ORAL | 0 refills | Status: DC

## 2016-10-26 MED ORDER — TIOTROPIUM BROMIDE-OLODATEROL 2.5-2.5 MCG/ACT IN AERS: 1.0000 | INHALATION_SPRAY | Freq: Every day | RESPIRATORY_TRACT | 0 refills | Status: DC

## 2016-10-26 MED ORDER — AMOXICILLIN-POT CLAVULANATE 875-125 MG PO TABS: 1.0000 | ORAL_TABLET | Freq: Two times a day (BID) | ORAL | 0 refills | Status: AC

## 2016-10-26 NOTE — Progress Notes (Signed)
Spoke with pt and notified of results per Dr. Wert. Pt verbalized understanding and denied any questions. 

## 2016-10-26 NOTE — Progress Notes (Signed)
Subjective:    Patient ID: Sherri King, female    DOB: 06/06/1940, 76 y.o.   MRN: [id number]  PROBLEM LIST: Moderate COPD Heavy ex smoker    HPI 62 -yowf last smoked  04/2014   With GOLD II copd with reversibility   DATA: PFTs 05/07/15 FVC 1.98 [70%) FEV1 1.23 (50%), post-BD FEV1 1.47 [70%], +19% change F/F 62 TLC 104% RV/TLC 120% DLCO 62%. Moderate-severe obstruction, positive bronchodilator response. Moderate reduction in diffusion capacity.     [place] recs  06/30/15  Continue using the Symbicort as prescribed. > no benefit We will refer you for an lung cancer screening program.> never did       10/26/2016 acute extended ov/Dennice Tindol re:  aecopd  Chief Complaint  Patient presents with  . Acute Visit    shortness of breath, wheezing, non productive cough. pt is using albuterol inhaler every 3-4 hours.    baseline doe = MMRC3 = can't walk 100 yards even at a slow pace at a flat grade s stopping due to sob  / cough then gradually worse over the last few weeks prior to OV   Albuterol helps for an hour Tramadol 50 mg qid helps the back  Am mucus yellow/ thick    No obvious patterns in  day to day or daytime variability or assoc excess/ purulent sputum or mucus plugs or hemoptysis or cp or chest tightness,  overt  hb symptoms. No unusual exp hx or h/o childhood pna/ asthma or knowledge of premature birth.  Sleeping ok flat without nocturnal    exacerbation  of respiratory  c/o's or need for noct saba. Also denies any obvious fluctuation of symptoms with weather or environmental changes or other aggravating or alleviating factors except as outlined above   Current Allergies, Complete Past Medical History, Past Surgical History, Family History, and Social History were reviewed in [employer] record.  ROS  The following are not active complaints unless bolded Hoarseness, sore throat, dysphagia, dental problems, itching, sneezing,  nasal congestion  or discharge of excess mucus or purulent secretions, ear ache,   fever, chills, sweats, unintended wt loss or wt gain, classically pleuritic or exertional cp,  orthopnea pnd or leg swelling, presyncope, palpitations, abdominal pain, anorexia, nausea, vomiting, diarrhea  or change in bowel habits or change in bladder habits, change in stools or change in urine, dysuria, hematuria,  rash, arthralgias, visual complaints, headache, numbness, weakness or ataxia or problems with walking or coordination,  change in mood/affect or memory.        Current Meds  Medication Sig  . acetaminophen (TYLENOL) 500 MG tablet Take 500 mg by mouth every 6 (six) hours as needed.  Marland Kitchen albuterol (PROVENTIL HFA;VENTOLIN HFA) 108 (90 BASE) MCG/ACT inhaler Inhale into the lungs every 6 (six) hours as needed for wheezing or shortness of breath.  Marland Kitchen albuterol (PROVENTIL) (2.5 MG/3ML) 0.083% nebulizer solution Take 2.5 mg by nebulization every 6 (six) hours as needed for wheezing or shortness of breath.  . B Complex Vitamins (B COMPLEX-B12) TABS Take 1 tablet by mouth daily.  . carboxymethylcellulose (REFRESH PLUS) 0.5 % SOLN Place 1 drop into both eyes daily.  . cholecalciferol (VITAMIN D) 1000 units tablet Take 1,000 Units by mouth daily.  . Multiple Vitamins-Minerals (MULTIVITAMIN WITH MINERALS) tablet Take 1 tablet by mouth daily.  . nitroGLYCERIN (NITROSTAT) 0.4 MG SL tablet Place 1 tablet (0.4 mg total) under the tongue every 5 (five) minutes as needed for chest pain.  Marland Kitchen  traMADol (ULTRAM) 50 MG tablet Take 1 tablet by mouth every 6 (six) hours as needed.                   Objective:   Physical Exam  amb hoarse wf   Wt Readings from Last 3 Encounters:  10/26/16 177 lb 2 oz (80.3 kg)  07/15/16 176 lb 3.2 oz (79.9 kg)  06/30/15 168 lb 12.8 oz (76.6 kg)    Vital signs reviewed  - Note on arrival 02 sats  93% on RA      HEENT: nl  turbinates bilaterally, and oropharynx. Nl external ear canals without cough  reflex - upper dentures   NECK :  without JVD/Nodes/TM/ nl carotid upstrokes bilaterally   LUNGS: no acc muscle use,  Nl contour chest with junky insp and exp rhonchi bilaterally    CV:  RRR  no s3 or murmur or increase in P2, and no edema   ABD:  soft and nontender with nl inspiratory excursion in the supine position. No bruits or organomegaly appreciated, bowel sounds nl  MS:  Nl gait/ ext warm without deformities, calf tenderness, cyanosis or clubbing No obvious joint restrictions   SKIN: warm and dry without lesions    NEURO:  alert, approp, nl sensorium with  no motor or cerebellar deficits apparent.     CXR PA and Lateral:   10/26/2016 :    I personally reviewed images and agree with radiology impression as follows:   1. Stable mild-to-moderate changes of chronic bronchitis and/or asthma. Linear scar or atelectasis in the inferior upper lobes. No acute cardiopulmonary disease otherwise.        Assessment & Plan:

## 2016-10-26 NOTE — Patient Instructions (Addendum)
Augmentin 875 mg take one pill twice daily  X 10 days - take at breakfast and supper with large glass of water.  It would help reduce the usual side effects (diarrhea and yeast infections) if you ate cultured yogurt at lunch.   Prednisone 10 mg take  4 each am x 2 days,   2 each am x 2 days,  1 each am x 2 days and stop    Plan A = Automatic = Stiolto 2 puffs each am   Work on Product manager inhaler technique:  relax and gently blow all the way out then take a nice smooth deep breath back in, triggering the inhaler at same time you start breathing in.  Hold for up to 5 seconds if you can. Blow out thru nose. Rinse and gargle with water when done      Plan B = Backup Only use your albuterol as a rescue medication to be used if you can't catch your breath by resting or doing a relaxed purse lip breathing pattern.  - The less you use it, the better it will work when you need it. - Ok to use the inhaler up to 2 puffs  every 4 hours if you must but call for appointment if use goes up over your usual need - Don't leave home without it !!  (think of it like the spare tire for your car)   Plan C = Crisis - only use your albuterol nebulizer if you first try Plan B and it fails to help > ok to use the nebulizer up to every 4 hours but if start needing it regularly call for immediate appointment  For cough > mucinex or mucinex  Dm up to 1200 mg every 12 hours as needed and use the tramadol up to 2 every 4 hours for severe cough   Please remember to go to the  x-ray department downstairs in the basement  for your tests - we will call you with the results when they are available.       Please schedule a follow up office visit in 4 weeks, sooner if needed with pfts to see Dr Vaughan Browner or his NP or me

## 2016-10-27 ENCOUNTER — Ambulatory Visit: Payer: Medicare Other | Admitting: Acute Care

## 2016-10-29 NOTE — Assessment & Plan Note (Addendum)
Quit smoking 04/2014  PFTs 05/07/15 FVC 1.98 [70%) FEV1 1.23 (50%), post-BD FEV1 1.47 [70%], +19% change F/F 62 TLC 104% RV/TLC 120% DLCO 62%.  - 10/26/2016  After extensive coaching device effectiveness =  90% with SMI > try stiolto 2 each am      Surprised that didn't feel symbicort helped so reasonable to try lama/laba next after rx for acute exac with augmentin/ pred x 6 days  > f/u Dr Vaughan Browner ? Change to symbicort/ spiriva if lama/laba not adequate and  proves to be group D pt which her hx suggests   I had an extended discussion with the patient reviewing all relevant studies completed to date and  lasting 15 to 20 minutes of a 25 minute acute office visit  With pt new to me  Each maintenance medication was reviewed in detail including most importantly the difference between maintenance and prns and under what circumstances the prns are to be triggered using an action plan format that is not reflected in the computer generated alphabetically organized AVS.    Please see AVS for specific instructions unique to this visit that I personally wrote and verbalized to the the pt in detail and then reviewed with pt  by my nurse highlighting any  changes in therapy recommended at today's visit to their plan of care.

## 2016-11-07 ENCOUNTER — Emergency Department (HOSPITAL_COMMUNITY): Payer: Medicare Other

## 2016-11-07 ENCOUNTER — Encounter (HOSPITAL_COMMUNITY): Payer: Self-pay | Admitting: Emergency Medicine

## 2016-11-07 ENCOUNTER — Emergency Department (HOSPITAL_COMMUNITY)
Admission: EM | Admit: 2016-11-07 | Discharge: 2016-11-07 | Disposition: A | Discharge status: Home / Self Care | Payer: Medicare Other | Attending: Emergency Medicine | Admitting: Emergency Medicine

## 2016-11-07 DIAGNOSIS — S0990XA Unspecified injury of head, initial encounter: Secondary | ICD-10-CM

## 2016-11-07 DIAGNOSIS — Z79899 Other long term (current) drug therapy: Secondary | ICD-10-CM | POA: Diagnosis not present

## 2016-11-07 DIAGNOSIS — Y999 Unspecified external cause status: Secondary | ICD-10-CM | POA: Diagnosis not present

## 2016-11-07 DIAGNOSIS — W01198A Fall on same level from slipping, tripping and stumbling with subsequent striking against other object, initial encounter: Secondary | ICD-10-CM | POA: Diagnosis not present

## 2016-11-07 DIAGNOSIS — Y929 Unspecified place or not applicable: Secondary | ICD-10-CM | POA: Diagnosis not present

## 2016-11-07 DIAGNOSIS — Y939 Activity, unspecified: Secondary | ICD-10-CM | POA: Insufficient documentation

## 2016-11-07 DIAGNOSIS — J449 Chronic obstructive pulmonary disease, unspecified: Secondary | ICD-10-CM | POA: Insufficient documentation

## 2016-11-07 DIAGNOSIS — W19XXXA Unspecified fall, initial encounter: Secondary | ICD-10-CM

## 2016-11-07 DIAGNOSIS — Z87891 Personal history of nicotine dependence: Secondary | ICD-10-CM | POA: Insufficient documentation

## 2016-11-07 DIAGNOSIS — Z8541 Personal history of malignant neoplasm of cervix uteri: Secondary | ICD-10-CM | POA: Insufficient documentation

## 2016-11-07 DIAGNOSIS — I10 Essential (primary) hypertension: Secondary | ICD-10-CM | POA: Insufficient documentation

## 2016-11-07 MED ORDER — HYDROCODONE-ACETAMINOPHEN 5-325 MG PO TABS
1.0000 | ORAL_TABLET | Freq: Once | ORAL | Status: AC
  Administered 2016-11-07: 1 via ORAL
  Filled 2016-11-07: qty 1

## 2016-11-07 MED ORDER — METHOCARBAMOL 500 MG PO TABS: 500.0000 mg | ORAL_TABLET | Freq: Three times a day (TID) | ORAL | 0 refills | Status: DC | PRN

## 2016-11-07 MED ORDER — TRAMADOL HCL 50 MG PO TABS: 50.0000 mg | ORAL_TABLET | Freq: Four times a day (QID) | ORAL | 0 refills | Status: DC | PRN

## 2016-11-07 NOTE — Discharge Instructions (Signed)
Follow-up with your primary care doctor.  Return here as needed °

## 2016-11-07 NOTE — ED Notes (Signed)
PT WENT TO XRAY THEN TO [facility] WAITING FOR HER RETURN

## 2016-11-07 NOTE — ED Notes (Signed)
The pt  [address] on Monday  stricking the back of her head  Abrasion there  noi loc.  Having hot and cold spells since then and aching all over and sl nausea

## 2016-11-07 NOTE — ED Triage Notes (Signed)
Pt. Stated, I fell on Monday I stepped back and hit my head. I ve had cold and hot flashes, when I get hot I get nauseated.  I have a headache. No LOC.

## 2016-11-07 NOTE — ED Provider Notes (Signed)
[facility] Provider Note   CSN: [id number] Arrival date & time: 11/07/16  1441     History   Chief Complaint Chief Complaint  Patient presents with  . Fall  . Chills  . Headache    HPI Sherri King is a 76 y.o. female.  HPI Patient presents to the emergency department with injuries that occurred following a fall.  The patient fell on Monday striking the back of her head.  She states she tripped over a space heater that was in the floor she states she fell backwards hitting her head against a heating vent in her home.  The patient states that initially she was dizzy.  She states that that lasted for about a day and got better.  She states now she does have a headache that is intermittent.  The patient states that she did not take any medications prior to arrival.  The patient denies chest pain, shortness of breath, blurred vision, neck pain, fever, cough, weakness, numbness, dizziness, anorexia, edema, abdominal pain, nausea, vomiting, diarrhea, rash, back pain, dysuria, hematemesis, bloody stool, near syncope, or syncope. Past Medical History:  Diagnosis Date  . Anxiety   . Arthritis   . Cancer ([place])    cervical  . COPD (chronic obstructive pulmonary disease) ([place])   . Coronary vasospasm ([place])   . Depression   . GERD (gastroesophageal reflux disease)   . Hyperlipidemia   . Hypertension   . Multiple thyroid nodules     Patient Active Problem List   Diagnosis Date Noted  . COPD GOLD II with reversibility  10/26/2016  . 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 HLD (hyperlipidemia) 05/04/2013  . Atypical chest pain 06/07/2011  . Coronary vasospasm ([place]) 06/07/2011  . Hypertension 06/07/2011  . Tobacco abuse 06/07/2011    Past Surgical History:  Procedure Laterality Date  . ABDOMINAL HYSTERECTOMY     due to cancer  . BREAST SURGERY Bilateral 2016   removal of several nodules from right breast  . THYROIDECTOMY, PARTIAL    .  TUBAL LIGATION    . VESICOVAGINAL FISTULA CLOSURE W/ TAH      OB History    No data available       Home Medications    Prior to Admission medications   Medication Sig Start Date End Date Taking? Authorizing Provider  acetaminophen (TYLENOL) 500 MG tablet Take 500 mg by mouth every 6 (six) hours as needed.    [provider]  albuterol (PROVENTIL HFA;VENTOLIN HFA) 108 (90 BASE) MCG/ACT inhaler Inhale into the lungs every 6 (six) hours as needed for wheezing or shortness of breath.    [provider]  albuterol (PROVENTIL) (2.5 MG/3ML) 0.083% nebulizer solution Take 2.5 mg by nebulization every 6 (six) hours as needed for wheezing or shortness of breath.    [provider]  B Complex Vitamins (B COMPLEX-B12) TABS Take 1 tablet by mouth daily.    [provider]  carboxymethylcellulose (REFRESH PLUS) 0.5 % SOLN Place 1 drop into both eyes daily.    [provider]  cholecalciferol (VITAMIN D) 1000 units tablet Take 1,000 Units by mouth daily.    [provider]  Multiple Vitamins-Minerals (MULTIVITAMIN WITH MINERALS) tablet Take 1 tablet by mouth daily.    [provider]  nitroGLYCERIN (NITROSTAT) 0.4 MG SL tablet Place 1 tablet (0.4 mg total) under the tongue every 5 (five) minutes as needed for chest pain. 05/04/13   Burtis Junes,  NP  predniSONE (DELTASONE) 10 MG tablet Take  4 each am x 2 days,   2 each am x 2 days,  1 each am x 2 days and stop 10/26/16   Tanda Rockers, MD  Tiotropium Bromide-Olodaterol (STIOLTO RESPIMAT) 2.5-2.5 MCG/ACT AERS Inhale 1 puff into the lungs daily. 10/26/16   Tanda Rockers, MD  traMADol (ULTRAM) 50 MG tablet Take 1 tablet by mouth every 6 (six) hours as needed. 03/03/15   [provider]    Family History Family History  Problem Relation Age of Onset  . Heart failure Mother   . Stroke Mother   . Cancer Father        lung  . Cancer Sister        lung, mets to brain  . Cancer  Sister        lung  . Heart disease Sister   . Allergies Sister        all 8 sisters  . Cancer Unknown        bone    Social History Social History   Tobacco Use  . Smoking status: Former Smoker    Packs/day: 1.00    Years: 60.00    Pack years: 60.00    Types: Cigarettes    Last attempt to quit: 04/28/2014    Years since quitting: 2.5  . Smokeless tobacco: Never Used  Substance Use Topics  . Alcohol use: Yes    Alcohol/week: 0.0 oz    Comment: occasional beer  . Drug use: No     Allergies   Bee venom   Review of Systems Review of Systems All other systems negative except as documented in the HPI. All pertinent positives and negatives as reviewed in the HPI.  Physical Exam Updated Vital Signs BP 112/64   Pulse 79   Temp 97.7 F (36.5 C) (Oral)   Resp 18   Ht 5\' 4"  (1.626 m)   Wt 80.3 kg (177 lb)   SpO2 94%   BMI 30.38 kg/m   Physical Exam  Constitutional: She is oriented to person, place, and time. She appears well-developed and well-nourished. No distress.  HENT:  Head: Normocephalic and atraumatic.  Mouth/Throat: Oropharynx is clear and moist.  Eyes: Pupils are equal, round, and reactive to light.  Neck: Normal range of motion. Neck supple.  Cardiovascular: Normal rate, regular rhythm and normal heart sounds. Exam reveals no gallop and no friction rub.  No murmur heard. Pulmonary/Chest: Effort normal and breath sounds normal. No respiratory distress. She has no wheezes.  Abdominal: Soft. Bowel sounds are normal. She exhibits no distension. There is no tenderness.  Neurological: She is alert and oriented to person, place, and time. She has normal strength. She exhibits normal muscle tone. She displays a negative Romberg sign. Coordination and gait normal. GCS eye subscore is 4. GCS verbal subscore is 5. GCS motor subscore is 6.  Skin: Skin is warm and dry. Capillary refill takes less than 2 seconds. No rash noted. No erythema.  Psychiatric: She has a  normal mood and affect. Her behavior is normal.  Nursing note and vitals reviewed.    ED Treatments / Results  Labs (all labs ordered are listed, but only abnormal results are displayed) Labs Reviewed - No data to display  EKG  EKG Interpretation None       Radiology Ct Head Wo Contrast  Result Date: 11/07/2016 CLINICAL DATA:  Patient fell 10/29. Patient is unsure if she hit head or  not, but has been having left parietal headaches since fall. No LOC EXAM: CT HEAD WITHOUT CONTRAST TECHNIQUE: Contiguous axial images were obtained from the base of the skull through the vertex without intravenous contrast. COMPARISON:  None. FINDINGS: Brain: No evidence of acute infarction, hemorrhage, hydrocephalus, extra-axial collection or mass lesion/mass effect. There is mild patchy periventricular white matter hypoattenuation consistent with chronic microvascular ischemic change. Vascular: No hyperdense vessel or unexpected calcification. Skull: Normal. Negative for fracture or focal lesion. Sinuses/Orbits: Globes and orbits are unremarkable. Visualized sinuses and mastoid air cells are clear. Other: None. IMPRESSION: 1. No acute intracranial abnormalities. 2. Mild chronic microvascular ischemic change. Electronically Signed   By: Lajean Manes M.D.   On: 11/07/2016 18:18   Dg Foot Complete Left  Result Date: 11/07/2016 CLINICAL DATA:  Pain after fall EXAM: LEFT FOOT - COMPLETE 3+ VIEW COMPARISON:  None. FINDINGS: There is no evidence of fracture or dislocation. There is no evidence of arthropathy or other focal bone abnormality. Soft tissues are unremarkable. IMPRESSION: Negative. Electronically Signed   By: Dorise Bullion III M.D   On: 11/07/2016 18:20   Dg Hip Unilat W Or Wo Pelvis 2-3 Views Right  Result Date: 11/07/2016 CLINICAL DATA:  Fall 6 days ago with pain to the right hip EXAM: DG HIP (WITH OR WITHOUT PELVIS) 2-3V RIGHT COMPARISON:  None. FINDINGS: Mild bilateral SI joint disease. Pubic  symphysis and rami are intact. No acute displaced fracture or malalignment is seen. IMPRESSION: No acute osseous abnormality Electronically Signed   By: Donavan Foil M.D.   On: 11/07/2016 18:20    Procedures Procedures (including critical care time)  Medications Ordered in ED Medications  HYDROcodone-acetaminophen (NORCO/VICODIN) 5-325 MG per tablet 1 tablet (1 tablet Oral Given 11/07/16 1933)     Initial Impression / Assessment and Plan / ED Course  I have reviewed the triage vital signs and the nursing notes.  Pertinent labs & imaging results that were available during my care of the patient were reviewed by me and considered in my medical decision making (see chart for details).    Patient has been stable here in the emergency department.  Patient has negative imaging here in the emergency department the fall occurred this past Monday she has been able to ambulate without difficulty her vital signs remained stable.  Final Clinical Impressions(s) / ED Diagnoses   Final diagnoses:  None    ED Discharge Orders    None       Dalia Heading, PA-C 11/07/16 2225    Duffy Bruce, MD 11/08/16 1134

## 2016-12-07 ENCOUNTER — Encounter: Payer: Self-pay | Admitting: Pulmonary Disease

## 2016-12-07 ENCOUNTER — Ambulatory Visit: Payer: Medicare Other | Admitting: Pulmonary Disease

## 2016-12-07 ENCOUNTER — Other Ambulatory Visit (INDEPENDENT_AMBULATORY_CARE_PROVIDER_SITE_OTHER): Payer: Medicare Other

## 2016-12-07 ENCOUNTER — Ambulatory Visit (INDEPENDENT_AMBULATORY_CARE_PROVIDER_SITE_OTHER): Payer: Medicare Other | Admitting: Pulmonary Disease

## 2016-12-07 VITALS — BP 122/80 | HR 81 | Ht 62.0 in | Wt 176.0 lb

## 2016-12-07 DIAGNOSIS — J439 Emphysema, unspecified: Secondary | ICD-10-CM

## 2016-12-07 DIAGNOSIS — R0600 Dyspnea, unspecified: Secondary | ICD-10-CM | POA: Diagnosis not present

## 2016-12-07 DIAGNOSIS — J449 Chronic obstructive pulmonary disease, unspecified: Secondary | ICD-10-CM | POA: Diagnosis not present

## 2016-12-07 LAB — PULMONARY FUNCTION TEST
DL/VA % pred: 76 %
DL/VA: 3.49 ml/min/mmHg/L
DLCO COR: 13.2 ml/min/mmHg
DLCO UNC % PRED: 63 %
DLCO UNC: 13.59 ml/min/mmHg
DLCO cor % pred: 61 %
FEF 25-75 PRE: 0.45 L/s
FEF 25-75 Post: 0.62 L/sec
FEF2575-%Change-Post: 37 %
FEF2575-%Pred-Post: 40 %
FEF2575-%Pred-Pre: 29 %
FEV1-%Change-Post: 13 %
FEV1-%PRED-PRE: 54 %
FEV1-%Pred-Post: 62 %
FEV1-POST: 1.18 L
FEV1-Pre: 1.04 L
FEV1FVC-%Change-Post: 11 %
FEV1FVC-%Pred-Pre: 75 %
FEV6-%CHANGE-POST: 2 %
FEV6-%PRED-PRE: 76 %
FEV6-%Pred-Post: 77 %
FEV6-POST: 1.89 L
FEV6-Pre: 1.84 L
FEV6FVC-%Change-Post: 0 %
FEV6FVC-%PRED-POST: 105 %
FEV6FVC-%Pred-Pre: 104 %
FVC-%Change-Post: 2 %
FVC-%Pred-Post: 74 %
FVC-%Pred-Pre: 72 %
FVC-Post: 1.89 L
FVC-Pre: 1.85 L
POST FEV6/FVC RATIO: 100 %
Post FEV1/FVC ratio: 63 %
Pre FEV1/FVC ratio: 56 %
Pre FEV6/FVC Ratio: 99 %
RV % pred: 135 %
RV: 2.97 L
TLC % PRED: 105 %
TLC: 5 L

## 2016-12-07 LAB — CBC WITH DIFFERENTIAL/PLATELET
BASOS PCT: 0.7 % (ref 0.0–3.0)
Basophils Absolute: 0.1 10*3/uL (ref 0.0–0.1)
EOS PCT: 3 % (ref 0.0–5.0)
Eosinophils Absolute: 0.3 10*3/uL (ref 0.0–0.7)
HEMATOCRIT: 43.9 % (ref 36.0–46.0)
HEMOGLOBIN: 14.3 g/dL (ref 12.0–15.0)
LYMPHS PCT: 21.4 % (ref 12.0–46.0)
Lymphs Abs: 2.3 10*3/uL (ref 0.7–4.0)
MCHC: 32.5 g/dL (ref 30.0–36.0)
MCV: 93.3 fl (ref 78.0–100.0)
Monocytes Absolute: 1.1 10*3/uL — ABNORMAL HIGH (ref 0.1–1.0)
Monocytes Relative: 10.4 % (ref 3.0–12.0)
NEUTROS ABS: 6.8 10*3/uL (ref 1.4–7.7)
Neutrophils Relative %: 64.5 % (ref 43.0–77.0)
PLATELETS: 200 10*3/uL (ref 150.0–400.0)
RBC: 4.71 Mil/uL (ref 3.87–5.11)
RDW: 14.1 % (ref 11.5–15.5)
WBC: 10.5 10*3/uL (ref 4.0–10.5)

## 2016-12-07 MED ORDER — PREDNISONE 10 MG PO TABS: ORAL_TABLET | ORAL | 0 refills | Status: DC

## 2016-12-07 MED ORDER — FLUTICASONE-UMECLIDIN-VILANT 100-62.5-25 MCG/INH IN AEPB: 1.0000 | INHALATION_SPRAY | Freq: Every day | RESPIRATORY_TRACT | 3 refills | Status: DC

## 2016-12-07 NOTE — Progress Notes (Signed)
PFT completed today 12/07/16.

## 2016-12-07 NOTE — Progress Notes (Signed)
CHYLA King    [id number]    11/03/1940  Primary Care Physician:Pharr, Thayer Jew, MD  Referring Physician: Deland Pretty, MD [facility] [address] [place], [place] [address]  Chief complaint:   Follow up for COPD Heavy ex smoker  HPI: Sherri King is a 76 year old with extensive smoking history. She complains of dyspnea on exertion for several years. She's noticed a worsening of her symptoms for the past 3 months. The symptoms are associated with daily cough, white sputum production. She had PFTs today which showed moderate obstructive defect with a bronchodilator response. She is on albuterol rescue inhaler but uses this very rarely. She had been told several years ago that she has asthma. She denies any seasonal allergies, rhinitis, postnasal drip, GERD.  Interim history: Seen as an acute visit 2 months ago for worsening dyspnea.  She was given a prednisone taper and started on stiolto.  She feels that the stiolto works well for her.  However she ran out of the inhaler 2 weeks ago with increasing wheezing.  Outpatient Encounter Medications as of 12/07/2016  Medication Sig  . acetaminophen (TYLENOL) 500 MG tablet Take 500 mg by mouth every 6 (six) hours as needed.  Marland Kitchen albuterol (PROVENTIL HFA;VENTOLIN HFA) 108 (90 BASE) MCG/ACT inhaler Inhale into the lungs every 6 (six) hours as needed for wheezing or shortness of breath.  Marland Kitchen albuterol (PROVENTIL) (2.5 MG/3ML) 0.083% nebulizer solution Take 2.5 mg by nebulization every 6 (six) hours as needed for wheezing or shortness of breath.  . B Complex Vitamins (B COMPLEX-B12) TABS Take 1 tablet by mouth daily.  . carboxymethylcellulose (REFRESH PLUS) 0.5 % SOLN Place 1 drop into both eyes daily.  . cholecalciferol (VITAMIN D) 1000 units tablet Take 1,000 Units by mouth daily.  . methocarbamol (ROBAXIN) 500 MG tablet Take 1 tablet (500 mg total) every 8 (eight) hours as needed by mouth for muscle spasms.  . Multiple  Vitamins-Minerals (MULTIVITAMIN WITH MINERALS) tablet Take 1 tablet by mouth daily.  . nitroGLYCERIN (NITROSTAT) 0.4 MG SL tablet Place 1 tablet (0.4 mg total) under the tongue every 5 (five) minutes as needed for chest pain.  . Tiotropium Bromide-Olodaterol (STIOLTO RESPIMAT) 2.5-2.5 MCG/ACT AERS Inhale 1 puff into the lungs daily. (Patient not taking: Reported on 12/07/2016)  . [DISCONTINUED] predniSONE (DELTASONE) 10 MG tablet Take  4 each am x 2 days,   2 each am x 2 days,  1 each am x 2 days and stop (Patient not taking: Reported on 12/07/2016)  . [DISCONTINUED] traMADol (ULTRAM) 50 MG tablet Take 1 tablet (50 mg total) every 6 (six) hours as needed by mouth for severe pain. (Patient not taking: Reported on 12/07/2016)   No facility-administered encounter medications on file as of 12/07/2016.     Allergies as of 12/07/2016 - Review Complete 12/07/2016  Allergen Reaction Noted  . Bee venom Diarrhea, Nausea And Vomiting, and Swelling 05/07/2015    Past Medical History:  Diagnosis Date  . Anxiety   . Arthritis   . Cancer ([place])    cervical  . COPD (chronic obstructive pulmonary disease) ([place])   . Coronary vasospasm ([place])   . Depression   . GERD (gastroesophageal reflux disease)   . Hyperlipidemia   . Hypertension   . Multiple thyroid nodules     Past Surgical History:  Procedure Laterality Date  . ABDOMINAL HYSTERECTOMY     due to cancer  . BREAST SURGERY Bilateral 2016   removal of  several nodules from right breast  . RADIOACTIVE SEED GUIDED EXCISIONAL BREAST BIOPSY Left 10/31/2014   Procedure: RADIOACTIVE SEED GUIDED EXCISIONAL LEFT BREAST BIOPSY;  Surgeon: Rolm Bookbinder, MD;  Location: [facility];  Service: General;  Laterality: Left;  . THYROIDECTOMY, PARTIAL    . TUBAL LIGATION    . VESICOVAGINAL FISTULA CLOSURE W/ TAH      Family History  Problem Relation Age of Onset  . Heart failure Mother   . Stroke Mother   . Cancer Father        lung  .  Cancer Sister        lung, mets to brain  . Cancer Sister        lung  . Heart disease Sister   . Allergies Sister        all 8 sisters  . Cancer Unknown        bone    Social History   Socioeconomic History  . Marital status: Widowed    Spouse name: Not on file  . Number of children: 3  . Years of education: Not on file  . Highest education level: Not on file  Social Needs  . Financial resource strain: Not on file  . Food insecurity - worry: Not on file  . Food insecurity - inability: Not on file  . Transportation needs - medical: Not on file  . Transportation needs - non-medical: Not on file  Occupational History  . Occupation: BUS DRIVER    Employer: [employer]  Tobacco Use  . Smoking status: Former Smoker    Packs/day: 1.00    Years: 60.00    Pack years: 60.00    Types: Cigarettes    Last attempt to quit: 04/28/2014    Years since quitting: 2.6  . Smokeless tobacco: Never Used  Substance and Sexual Activity  . Alcohol use: Yes    Alcohol/week: 0.0 oz    Comment: occasional beer  . Drug use: No  . Sexual activity: Not on file  Other Topics Concern  . Not on file  Social History Narrative   Caffeine: 1-2 cups per week   Exercise: 2x daily, 3-4 times per week   Widowed, lives with son   3 children   Retired - Arts development officer, drove a school bus   2 dogs   1 of 8 daughters, 1 brother who lives in [place]   No recent travel    Review of systems: Review of Systems  Constitutional: Negative for fever and chills.  HENT: Negative.   Eyes: Negative for blurred vision.  Respiratory: as per HPI  Cardiovascular: Negative for chest pain and palpitations.  Gastrointestinal: Negative for vomiting, diarrhea, blood per rectum. Genitourinary: Negative for dysuria, urgency, frequency and hematuria.  Musculoskeletal: Negative for myalgias, back pain and joint pain.  Skin: Negative for itching and rash.  Neurological: Negative for dizziness, tremors,  focal weakness, seizures and loss of consciousness.  Endo/Heme/Allergies: Negative for environmental allergies.  Psychiatric/Behavioral: Negative for depression, suicidal ideas and hallucinations.  All other systems reviewed and are negative.  Physical Exam: Blood pressure 122/80, pulse 81, height 5\' 2"  (1.575 m), weight 176 lb (79.8 kg), SpO2 95 %. Gen:      No acute distress HEENT:  EOMI, sclera anicteric Neck:     No masses; no thyromegaly Lungs:    Clear to auscultation bilaterally; normal respiratory effort CV:         Regular rate and rhythm; no murmurs Abd:      +  bowel sounds; soft, non-tender; no palpable masses, no distension Ext:    No edema; adequate peripheral perfusion Skin:      Warm and dry; no rash Neuro: alert and oriented x 3 Psych: normal mood and affect  Data Reviewed: PFTs 05/07/15 FVC 1.98 [70%), FEV1 1.23 (50%), post-BD FEV1 1.47 [70%], +19% change, F/F 62, TLC 104%, RV/TLC 120%, DLCO 62%. Moderate-severe obstruction, positive bronchodilator response. Moderate reduction in diffusion capacity.  PFTs 12/07/16 FVC 1.89 [24%), FEV1 1.18 [62%), TLC 105%, DLCO 63% Moderate obstruction with moderate diffusion defect  Chest x-ray 10/26/16- chronic bronchitis, hyperinflation, atelectasis.  I have reviewed the images personally  CBC 07/10/15- absolute eosinophilic count 330  Assessment:  COPD, emphysema Currently in the middle of an exacerbation after she ran out of stiolto.  We will give her round of prednisone Resume inhalers.  I believe she benefit from inhaled steroids as she has mild increase in eosinophil count and a bronchodilator response Prescribe trelegy Check CBC with differential, blood allergic profile and alpha-1 antitrypsin levels and phenotype.  Plan/Recommendations: - Start trelegy, predisone taper - Check CBC with diff, blood allergy profile, A1AT levels  Marshell Garfinkel MD [facility] Pager [phone] 12/07/2016, 12:33  PM  CC: Deland Pretty, MD

## 2016-12-07 NOTE — Patient Instructions (Addendum)
We will check CBC differential, blood allergy profile, alpha-1 antitrypsin levels and phenotype Prednisone taper starting at 40 mg.  Reduce dose by 10 mg every 3 days. We will give you samples and give a prescription for trelegy inhaler Continue using the albuterol inhaler as needed Follow-up in 3 months.

## 2016-12-07 NOTE — Progress Notes (Signed)
cb

## 2017-03-14 ENCOUNTER — Other Ambulatory Visit: Payer: Medicare Other

## 2017-03-14 ENCOUNTER — Ambulatory Visit: Payer: Medicare Other | Admitting: Pulmonary Disease

## 2017-03-14 ENCOUNTER — Encounter: Payer: Self-pay | Admitting: Pulmonary Disease

## 2017-03-14 VITALS — BP 134/82 | HR 82 | Ht 64.0 in | Wt 180.6 lb

## 2017-03-14 DIAGNOSIS — J449 Chronic obstructive pulmonary disease, unspecified: Secondary | ICD-10-CM | POA: Diagnosis not present

## 2017-03-14 MED ORDER — FLUTICASONE-UMECLIDIN-VILANT 100-62.5-25 MCG/INH IN AEPB: 1.0000 | INHALATION_SPRAY | Freq: Every day | RESPIRATORY_TRACT | 3 refills | Status: DC

## 2017-03-14 NOTE — Patient Instructions (Addendum)
I am glad that your breathing is doing better We will continue the trelegy Follow-up in 6 months.

## 2017-03-14 NOTE — Progress Notes (Signed)
Sherri King    [id number]    03/12/1940  Primary Care Physician:Pharr, Thayer Jew, MD  Referring Physician: Deland Pretty, MD [place] [place], [place] [address]  Chief complaint:   Follow up for COPD Heavy ex smoker  HPI: Sherri King is a 77 year old with extensive smoking history. She complains of dyspnea on exertion for several years. She's noticed a worsening of her symptoms for the past 3 months. The symptoms are associated with daily cough, white sputum production. She had PFTs today which showed moderate obstructive defect with a bronchodilator response. She is on albuterol rescue inhaler but uses this very rarely. She had been told several years ago that she has asthma. She denies any seasonal allergies, rhinitis, postnasal drip, GERD.  Interim history: Started on trelegy inhaler at last visit.  She states that this is improved her breathing tremendously She has not needed to use her albuterol inhaler.  No new complaints today.  Outpatient Encounter Medications as of 03/14/2017  Medication Sig  . acetaminophen (TYLENOL) 500 MG tablet Take 500 mg by mouth every 6 (six) hours as needed.  Marland Kitchen albuterol (PROVENTIL HFA;VENTOLIN HFA) 108 (90 BASE) MCG/ACT inhaler Inhale into the lungs every 6 (six) hours as needed for wheezing or shortness of breath.  Marland Kitchen albuterol (PROVENTIL) (2.5 MG/3ML) 0.083% nebulizer solution Take 2.5 mg by nebulization every 6 (six) hours as needed for wheezing or shortness of breath.  . B Complex Vitamins (B COMPLEX-B12) TABS Take 1 tablet by mouth daily.  . carboxymethylcellulose (REFRESH PLUS) 0.5 % SOLN Place 1 drop into both eyes daily.  . cholecalciferol (VITAMIN D) 1000 units tablet Take 1,000 Units by mouth daily.  . Fluticasone-Umeclidin-Vilant (TRELEGY ELLIPTA) 100-62.5-25 MCG/INH AEPB Inhale 1 spray into the lungs daily.  . methocarbamol (ROBAXIN) 500 MG tablet Take 1 tablet (500 mg total) every 8 (eight) hours as  needed by mouth for muscle spasms.  . Multiple Vitamins-Minerals (MULTIVITAMIN WITH MINERALS) tablet Take 1 tablet by mouth daily.  . nitroGLYCERIN (NITROSTAT) 0.4 MG SL tablet Place 1 tablet (0.4 mg total) under the tongue every 5 (five) minutes as needed for chest pain.  . [DISCONTINUED] predniSONE (DELTASONE) 10 MG tablet Take 4 tabs for 3 days Take 3 tabs for 3 days Take 2 tabs for 3 days Take 1 tab for 3 days  . [DISCONTINUED] Tiotropium Bromide-Olodaterol (STIOLTO RESPIMAT) 2.5-2.5 MCG/ACT AERS Inhale 1 puff into the lungs daily.   No facility-administered encounter medications on file as of 03/14/2017.     Allergies as of 03/14/2017 - Review Complete 03/14/2017  Allergen Reaction Noted  . Bee venom Diarrhea, Nausea And Vomiting, and Swelling 05/07/2015    Past Medical History:  Diagnosis Date  . Anxiety   . Arthritis   . Cancer ([place])    cervical  . COPD (chronic obstructive pulmonary disease) ([place])   . Coronary vasospasm ([place])   . Depression   . GERD (gastroesophageal reflux disease)   . Hyperlipidemia   . Hypertension   . Multiple thyroid nodules     Past Surgical History:  Procedure Laterality Date  . ABDOMINAL HYSTERECTOMY     due to cancer  . BREAST SURGERY Bilateral 2016   removal of several nodules from right breast  . RADIOACTIVE SEED GUIDED EXCISIONAL BREAST BIOPSY Left 10/31/2014   Procedure: RADIOACTIVE SEED GUIDED EXCISIONAL LEFT BREAST BIOPSY;  Surgeon: Rolm Bookbinder, MD;  Location: [facility];  Service: General;  Laterality: Left;  .  THYROIDECTOMY, PARTIAL    . TUBAL LIGATION    . VESICOVAGINAL FISTULA CLOSURE W/ TAH      Family History  Problem Relation Age of Onset  . Heart failure Mother   . Stroke Mother   . Cancer Father        lung  . Cancer Sister        lung, mets to brain  . Cancer Sister        lung  . Heart disease Sister   . Allergies Sister        all 8 sisters  . Cancer Unknown        bone    Social  History   Socioeconomic History  . Marital status: Widowed    Spouse name: Not on file  . Number of children: 3  . Years of education: Not on file  . Highest education level: Not on file  Social Needs  . Financial resource strain: Not on file  . Food insecurity - worry: Not on file  . Food insecurity - inability: Not on file  . Transportation needs - medical: Not on file  . Transportation needs - non-medical: Not on file  Occupational History  . Occupation: BUS DRIVER    Employer: [employer]  Tobacco Use  . Smoking status: Former Smoker    Packs/day: 1.00    Years: 60.00    Pack years: 60.00    Types: Cigarettes    Last attempt to quit: 04/28/2014    Years since quitting: 2.8  . Smokeless tobacco: Never Used  Substance and Sexual Activity  . Alcohol use: Yes    Alcohol/week: 0.0 oz    Comment: occasional beer  . Drug use: No  . Sexual activity: Not on file  Other Topics Concern  . Not on file  Social History Narrative   Caffeine: 1-2 cups per week   Exercise: 2x daily, 3-4 times per week   Widowed, lives with son   3 children   Retired - Arts development officer, drove a school bus   2 dogs   1 of 8 daughters, 1 brother who lives in [place]   No recent travel    Review of systems: Review of Systems  Constitutional: Negative for fever and chills.  HENT: Negative.   Eyes: Negative for blurred vision.  Respiratory: as per HPI  Cardiovascular: Negative for chest pain and palpitations.  Gastrointestinal: Negative for vomiting, diarrhea, blood per rectum. Genitourinary: Negative for dysuria, urgency, frequency and hematuria.  Musculoskeletal: Negative for myalgias, back pain and joint pain.  Skin: Negative for itching and rash.  Neurological: Negative for dizziness, tremors, focal weakness, seizures and loss of consciousness.  Endo/Heme/Allergies: Negative for environmental allergies.  Psychiatric/Behavioral: Negative for depression, suicidal ideas and  hallucinations.  All other systems reviewed and are negative.  Physical Exam: Blood pressure 134/82, pulse 82, height 5\' 4"  (1.626 m), weight 180 lb 9.6 oz (81.9 kg), SpO2 95 %. Gen:      No acute distress HEENT:  EOMI, sclera anicteric Neck:     No masses; no thyromegaly Lungs:    Clear to auscultation bilaterally; normal respiratory effort CV:         Regular rate and rhythm; no murmurs Abd:      + bowel sounds; soft, non-tender; no palpable masses, no distension Ext:    No edema; adequate peripheral perfusion Skin:      Warm and dry; no rash Neuro: alert and oriented x 3 Psych: normal  mood and affect  Data Reviewed: PFTs 05/07/15 FVC 1.98 [70%), FEV1 1.23 (50%), post-BD FEV1 1.47 [70%], +19% change, F/F 62, TLC 104%, RV/TLC 120%, DLCO 62%. Moderate-severe obstruction, positive bronchodilator response. Moderate reduction in diffusion capacity.  PFTs 12/07/16 FVC 1.89 [24%), FEV1 1.18 [62%), TLC 105%, DLCO 63% Moderate obstruction with moderate diffusion defect  Chest x-ray 10/26/16- chronic bronchitis, hyperinflation, atelectasis.  I have reviewed the images personally  CBC 07/10/15- absolute eosinophilic count 076 CBC 15/1/83-UPBDHDIX eosinophil count 315  Assessment:  COPD, emphysema Stable on trelegy inhaler Noted to have mild peripheral eosinophilia. Check blood allergic profile and alpha-1 antitrypsin levels and phenotype.  Plan/Recommendations: - Continue trelegy - Check blood allergy profile, A1AT levels  Marshell Garfinkel MD [facility] Pager [phone] 03/14/2017, 1:34 PM  CC: Deland Pretty, MD

## 2017-03-23 LAB — RESPIRATORY ALLERGY PROFILE REGION II ~~LOC~~
Allergen, A. alternata, m6: <0.1 kU/L
Allergen, Comm Silver Birch, t9: <0.1 kU/L
Allergen, Cottonwood, t14: <0.1 kU/L
Allergen, Mulberry, t76: <0.1 kU/L
Bermuda Grass: <0.1 kU/L
CLADOSPORIUM HERBARUM (M2) IGE: <0.1 kU/L
CLASS: 0
CLASS: 0
CLASS: 0
CLASS: 0
CLASS: 0
CLASS: 0
CLASS: 0
CLASS: 0
CLASS: 0
CLASS: 0
CLASS: 0
CLASS: 0
CLASS: 0
COCKROACH: 0.1 kU/L — AB
COMMON RAGWEED (SHORT) (W1) IGE: <0.1 kU/L
Class: 0
Class: 0
Class: 0
Class: 0
Class: 0
Class: 0
Class: 0
Class: 0
Class: 0
Class: 0
Class: 0
Dog Dander: <0.1 kU/L
IGE (IMMUNOGLOBULIN E), SERUM: 51 kU/L (ref <=114)
Pecan/Hickory Tree IgE: <0.1 kU/L
Rough Pigweed  IgE: <0.1 kU/L
Sheep Sorrel IgE: <0.1 kU/L

## 2017-03-23 LAB — INTERPRETATION:

## 2017-03-23 LAB — ALPHA-1 ANTITRYPSIN PHENOTYPE: A-1 Antitrypsin, Ser: 136 mg/dL (ref 83–199)

## 2017-08-17 ENCOUNTER — Encounter (HOSPITAL_COMMUNITY): Payer: Self-pay | Admitting: Emergency Medicine

## 2017-08-17 ENCOUNTER — Emergency Department (HOSPITAL_COMMUNITY)
Admission: EM | Admit: 2017-08-17 | Discharge: 2017-08-17 | Disposition: A | Discharge status: Home / Self Care | Payer: Medicare Other | Attending: Emergency Medicine | Admitting: Emergency Medicine

## 2017-08-17 ENCOUNTER — Emergency Department (HOSPITAL_COMMUNITY): Payer: Medicare Other

## 2017-08-17 ENCOUNTER — Other Ambulatory Visit: Payer: Self-pay

## 2017-08-17 DIAGNOSIS — J449 Chronic obstructive pulmonary disease, unspecified: Secondary | ICD-10-CM | POA: Diagnosis not present

## 2017-08-17 DIAGNOSIS — S9031XA Contusion of right foot, initial encounter: Secondary | ICD-10-CM | POA: Insufficient documentation

## 2017-08-17 DIAGNOSIS — Z8541 Personal history of malignant neoplasm of cervix uteri: Secondary | ICD-10-CM | POA: Insufficient documentation

## 2017-08-17 DIAGNOSIS — Z79899 Other long term (current) drug therapy: Secondary | ICD-10-CM | POA: Insufficient documentation

## 2017-08-17 DIAGNOSIS — W109XXA Fall (on) (from) unspecified stairs and steps, initial encounter: Secondary | ICD-10-CM | POA: Diagnosis not present

## 2017-08-17 DIAGNOSIS — Z87891 Personal history of nicotine dependence: Secondary | ICD-10-CM | POA: Diagnosis not present

## 2017-08-17 DIAGNOSIS — I10 Essential (primary) hypertension: Secondary | ICD-10-CM | POA: Diagnosis not present

## 2017-08-17 DIAGNOSIS — Y9289 Other specified places as the place of occurrence of the external cause: Secondary | ICD-10-CM | POA: Diagnosis not present

## 2017-08-17 DIAGNOSIS — Y9301 Activity, walking, marching and hiking: Secondary | ICD-10-CM | POA: Diagnosis not present

## 2017-08-17 DIAGNOSIS — Y998 Other external cause status: Secondary | ICD-10-CM | POA: Insufficient documentation

## 2017-08-17 DIAGNOSIS — S99921A Unspecified injury of right foot, initial encounter: Secondary | ICD-10-CM | POA: Diagnosis present

## 2017-08-17 NOTE — ED Triage Notes (Signed)
Pt presents with left knee and right foot pain after a mechanical fall. Pt did hit head slightly with NO LOC. Denies blood thinners.

## 2017-08-17 NOTE — Discharge Instructions (Signed)
If you develop a headache, vomiting, blurry vision, or any other new/concerning symptoms or return to the ER for evaluation.

## 2017-08-17 NOTE — ED Provider Notes (Signed)
[facility] DEPT Provider Note   CSN: [id number] Arrival date & time: 08/17/17  9244     History   Chief Complaint Chief Complaint  Patient presents with  . Fall    HPI Sherri King is a 77 y.o. female.  HPI  77 year old female presents with right foot pain after a fall.  The patient states that at around 6 AM she missed a step and fell about 3 steps.  She thinks her foot everted.  She is able to stand and bear weight but it is painful and she limps.  She also temporarily had some left knee pain but states that pain is completely gone and there is no tenderness or swelling.  When asked, she tells me she did hit the back of her head but did not lose consciousness and has no headache.  She is on a baby aspirin every day.  No neck pain.  Pain is about 8/10 in the foot. No preceding dizziness or pain, just missed a step.  Past Medical History:  Diagnosis Date  . Anxiety   . Arthritis   . Cancer ([place])    cervical  . COPD (chronic obstructive pulmonary disease) ([place])   . Coronary vasospasm ([place])   . Depression   . GERD (gastroesophageal reflux disease)   . Hyperlipidemia   . Hypertension   . Multiple thyroid nodules     Patient Active Problem List   Diagnosis Date Noted  . COPD GOLD II with reversibility  10/26/2016  . 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 HLD (hyperlipidemia) 05/04/2013  . Atypical chest pain 06/07/2011  . Coronary vasospasm ([place]) 06/07/2011  . Hypertension 06/07/2011  . Tobacco abuse 06/07/2011    Past Surgical History:  Procedure Laterality Date  . ABDOMINAL HYSTERECTOMY     due to cancer  . BREAST SURGERY Bilateral 2016   removal of several nodules from right breast  . RADIOACTIVE SEED GUIDED EXCISIONAL BREAST BIOPSY Left 10/31/2014   Procedure: RADIOACTIVE SEED GUIDED EXCISIONAL LEFT BREAST BIOPSY;  Surgeon: Rolm Bookbinder, MD;  Location: [facility];  Service: General;  Laterality: Left;  .  THYROIDECTOMY, PARTIAL    . TUBAL LIGATION    . VESICOVAGINAL FISTULA CLOSURE W/ TAH       OB History   None      Home Medications    Prior to Admission medications   Medication Sig Start Date End Date Taking? Authorizing Provider  acetaminophen (TYLENOL) 500 MG tablet Take 500 mg by mouth every 6 (six) hours as needed.    [provider]  albuterol (PROVENTIL HFA;VENTOLIN HFA) 108 (90 BASE) MCG/ACT inhaler Inhale into the lungs every 6 (six) hours as needed for wheezing or shortness of breath.    [provider]  albuterol (PROVENTIL) (2.5 MG/3ML) 0.083% nebulizer solution Take 2.5 mg by nebulization every 6 (six) hours as needed for wheezing or shortness of breath.    [provider]  B Complex Vitamins (B COMPLEX-B12) TABS Take 1 tablet by mouth daily.    [provider]  carboxymethylcellulose (REFRESH PLUS) 0.5 % SOLN Place 1 drop into both eyes daily.    [provider]  cholecalciferol (VITAMIN D) 1000 units tablet Take 1,000 Units by mouth daily.    [provider]  Fluticasone-Umeclidin-Vilant (TRELEGY ELLIPTA) 100-62.5-25 MCG/INH AEPB Inhale 1 spray into the lungs daily. 03/14/17   Mannam, Hart Robinsons, MD  methocarbamol (ROBAXIN) 500 MG tablet Take 1 tablet (500 mg total) every 8 (  eight) hours as needed by mouth for muscle spasms. 11/07/16   Lawyer, Harrell Gave, PA-C  Multiple Vitamins-Minerals (MULTIVITAMIN WITH MINERALS) tablet Take 1 tablet by mouth daily.    [provider]  nitroGLYCERIN (NITROSTAT) 0.4 MG SL tablet Place 1 tablet (0.4 mg total) under the tongue every 5 (five) minutes as needed for chest pain. 05/04/13   Burtis Junes, NP    Family History Family History  Problem Relation Age of Onset  . Heart failure Mother   . Stroke Mother   . Cancer Father        lung  . Cancer Sister        lung, mets to brain  . Cancer Sister        lung  . Heart disease Sister   . Allergies Sister        all 8  sisters  . Cancer Unknown        bone    Social History Social History   Tobacco Use  . Smoking status: Former Smoker    Packs/day: 1.00    Years: 60.00    Pack years: 60.00    Types: Cigarettes    Last attempt to quit: 04/28/2014    Years since quitting: 3.3  . Smokeless tobacco: Never Used  Substance Use Topics  . Alcohol use: Yes    Alcohol/week: 0.0 standard drinks    Comment: occasional beer  . Drug use: No     Allergies   Bee venom   Review of Systems Review of Systems  Musculoskeletal: Positive for arthralgias.  Neurological: Negative for dizziness, syncope and headaches.     Physical Exam Updated Vital Signs BP 138/73   Pulse 68   Temp 98 F (36.7 C) (Oral)   Resp 16   SpO2 96%   Physical Exam  Constitutional: She is oriented to person, place, and time. She appears well-developed and well-nourished. No distress.  HENT:  Head: Normocephalic and atraumatic.  Right Ear: External ear normal.  Left Ear: External ear normal.  Nose: Nose normal.  No scalp tenderness, hematoma or signs of trauma  Eyes: Pupils are equal, round, and reactive to light. EOM are normal. Right eye exhibits no discharge. Left eye exhibits no discharge.  Neck: No spinous process tenderness and no muscular tenderness present.  Cardiovascular: Normal rate, regular rhythm and normal heart sounds.  Pulses:      Dorsalis pedis pulses are 2+ on the right side, and 2+ on the left side.  Pulmonary/Chest: Effort normal and breath sounds normal.  Abdominal: Soft. There is no tenderness.  Musculoskeletal:       Left knee: She exhibits normal range of motion and no swelling. No tenderness found.       Right ankle: She exhibits normal range of motion and no swelling. No tenderness. Achilles tendon exhibits no pain and no defect.       Right lower leg: She exhibits no tenderness.       Right foot: There is tenderness and swelling.       Feet:  Neurological: She is alert and oriented to  person, place, and time.  CN 3-12 grossly intact. 5/5 strength in all 4 extremities.   Skin: Skin is warm and dry. She is not diaphoretic.  Nursing note and vitals reviewed.    ED Treatments / Results  Labs (all labs ordered are listed, but only abnormal results are displayed) Labs Reviewed - No data to display  EKG None  Radiology Dg  Foot Complete Right  Result Date: 08/17/2017 CLINICAL DATA:  Right foot injury when the patient tripped on stairs this morning. Pain. Initial encounter. EXAM: RIGHT FOOT COMPLETE - 3+ VIEW COMPARISON:  None. FINDINGS: There is no evidence of fracture or dislocation. There is no evidence of arthropathy or other focal bone abnormality. Calcaneal spurring noted. Soft tissues are unremarkable. IMPRESSION: No acute abnormality. Electronically Signed   By: Inge Rise M.D.   On: 08/17/2017 08:41    Procedures Procedures (including critical care time)  Medications Ordered in ED Medications - No data to display   Initial Impression / Assessment and Plan / ED Course  I have reviewed the triage vital signs and the nursing notes.  Pertinent labs & imaging results that were available during my care of the patient were reviewed by me and considered in my medical decision making (see chart for details).     Patient originally had a little bit of left knee pain after the fall but has no recurrent pain or tenderness and declines x-ray imaging.  Given benign exam at this time I think is unlikely she has a significant knee injury.  The right foot appears to be a proximal foot contusion but there is no fracture seen.  She did have minimal head trauma and while she is on aspirin and is elderly I did discuss CT scan but she declines.  I think this is pretty reasonable given it sounds like the impact of her head was low and she has no headache, vomiting, or any other signs of an acute, significant head injury.  We did discuss return precautions related to this.   Otherwise ice and elevate.  Final Clinical Impressions(s) / ED Diagnoses   Final diagnoses:  Contusion of right foot, initial encounter    ED Discharge Orders    None       Sherwood Gambler, MD 08/17/17 [phone]

## 2017-10-17 ENCOUNTER — Ambulatory Visit: Payer: Medicare Other | Admitting: Sports Medicine

## 2017-10-18 ENCOUNTER — Ambulatory Visit: Payer: Medicare Other | Admitting: Sports Medicine

## 2017-10-18 ENCOUNTER — Ambulatory Visit
Admission: RE | Admit: 2017-10-18 | Discharge: 2017-10-18 | Disposition: A | Discharge status: Home / Self Care | Payer: Medicare Other | Source: Ambulatory Visit | Attending: Sports Medicine | Admitting: Sports Medicine

## 2017-10-18 VITALS — BP 132/82 | Ht 64.25 in | Wt 170.0 lb

## 2017-10-18 DIAGNOSIS — M25562 Pain in left knee: Secondary | ICD-10-CM

## 2017-10-18 DIAGNOSIS — M25561 Pain in right knee: Secondary | ICD-10-CM | POA: Diagnosis not present

## 2017-10-19 ENCOUNTER — Encounter: Payer: Self-pay | Admitting: Sports Medicine

## 2017-10-19 NOTE — Progress Notes (Signed)
   Subjective:    Patient ID: Sherri King, female    DOB: [birth year], 77 y.o.   MRN: [id number]  HPI chief complaint: Bilateral knee pain  Very pleasant 77 year old female comes in today complaining of 1 year of bilateral knee pain.  Right knee is worse than the left.  She localizes the pain to the medial knee.  Pain will occasionally radiate down the leg.  She does endorse some weakness.  Some mild instability.  No swelling.  No locking or catching.  No recent x-rays.  No groin pain.  She takes Tylenol and tramadol for pain as needed.  No recent trauma.  Past medical history reviewed.  She has a history of osteoporosis. Medications reviewed Allergies reviewed    Review of Systems As above    Objective:   Physical Exam  Well-developed, well-nourished.  No acute distress.  Awake alert and oriented x3.  Vital signs reviewed.  Examination of both knees shows range of motion from 0 to about 120 degrees.  No obvious effusion.  She is tender to palpation along the medial joint lines bilaterally.  No tenderness along the lateral joint line.  Negative McMurray's.  Knees are stable to ligamentous exam.  She does have some tenderness to palpation over the right pes anserine bursa.  Neurovascularly intact distally.  Walking with a slight limp.  X-rays of both knees including AP, lateral, and sunrise views shows mild to moderate patellofemoral DJD but fairly well preserved medial and lateral joint spaces.  Nothing acute.      Assessment & Plan:   Bilateral knee pain, right greater than left, likely secondary to DJD versus degenerative meniscal tear Possible right knee pes anserine bursitis  I talked with the patient about treatment options.  Symptoms are not significant enough now for cortisone injection.  I've given her a simple isometric quad exercise to start doing daily to help build strength in her legs.  She may continue with tramadol as needed for pain.  Follow-up with me in 4  weeks for reevaluation.  If symptoms worsen then reconsider cortisone injection.  Call with questions or concerns in the interim.

## 2017-11-22 ENCOUNTER — Ambulatory Visit: Payer: Medicare Other | Admitting: Sports Medicine

## 2017-11-29 ENCOUNTER — Ambulatory Visit: Payer: Medicare Other | Admitting: Sports Medicine

## 2017-11-29 ENCOUNTER — Encounter: Payer: Self-pay | Admitting: Sports Medicine

## 2017-11-29 VITALS — BP 141/79 | Ht 64.0 in | Wt 177.0 lb

## 2017-11-29 DIAGNOSIS — M25511 Pain in right shoulder: Secondary | ICD-10-CM

## 2017-11-29 NOTE — Progress Notes (Signed)
Subjective:    Patient ID: Sherri King, female    DOB: 02-20-1940, 77 y.o.   MRN: [id number]  HPI Patient is a 77 year old female who presents for follow up of right knee pain as well as evaluation for new right shoulder pain.  Her knee pain has been present for the last several months. She had no injury or trauma to the knee. It was located along the medial joint line and was aggravated by stairs and walking. At her last visit her exam was consistent with arthritis vs meniscus injury. Xrays of the knee showed post-patellar degenerative changes. Patient was given home PT exercises which she has been doing daily. She notes 70% improvement in her knee pain since her last visit. She is able to walk without difficulty and go up and down stairs. She is able to perform all of her ADLs. Patient is taking tramadol which she notes also helps her knee pain. The pain does not radiate. She has no numbness or tingling in her legs/feet. She notes her strength has improved since her last visit.  Patient also presents for evaluation of right shoulder pain that started yesterday. The pain was present upon waking. She had no recent injury or trauma to the shoulder. The pain is lateral. She denies neck pain or pain shooting down her arm. She is unable to abduct or forward flex her arm due to the pain. She has not done any therapy or exercises for it yet. She notes the tramadol she is taking does not affect her shoulder pain. Patient had no recent increase in activity preceding the onset of pain.   Review of Systems See HPI    Objective:   Physical Exam General: No acute distress. Alert, oriented Resp: Breathing comfortably Circ: Extremities warm and well perfused MSK Right knee Inspection: No deformity. No swelling Palpation: Minimal medial joint line tenderness. Remainder of knee non-tender to palpation ROM: Normal range of motion with flexion, extension Strength: 5/5 strength with flexion,  extension McMurrays: Negative Varus stress: Negative Valgus stress: Negative Lachmans: Negative  Right shoulder Inspection: No deformity. No scapular winging Palpation: Lateral shoulder tender to palpation over head of humerus. Remainder of shoulder non-tender to palpation ROM: Abduction: 90 degrees with active range of motion, 110 with passive. Flexion: 150 degrees with active range of motion, 180 with passive range of motion. Internal rotation: Patient able to reach to L2-L3. External rotation: Normal with passive and active range of motion Strength: 4/5 strength with flexion, abduction, internal rotation, external rotation. Limited due to pain Positive lift off test Hawkins: Positive Neers: Positive Empty can: Positive Speeds: Negative Yergusons: Negative O'briens: Negative Cross arm: Positive      Assessment & Plan:  Patient is a 77 year old female who presents for follow up of right knee pain as well as evaluation for new right shoulder pain.  1. Right knee pain -Likely 2/2 patellofemoral DJD given xray findings -Significantly improved from last visit -continue tramadol -Continue home exercises  2. Right shoulder pain -Likely 2/2 supraspinatus tendonitis vs bursitis -Patient not showing evidence of adhesive capsulitis on exam -Patient given range of motion and strengthening exercises such as Jobe's, pendulums, and wall climbs.  -Patient may take tylenol and ice shoulder as needed for pain   Follow up in 4 weeks but sooner if shoulder pain persists.  I discussed the possibility of a subacromial cortisone injection next week if her pain does not quickly improve.  She is unable to take  NSAIDs.

## 2017-12-21 ENCOUNTER — Telehealth: Payer: Self-pay | Admitting: Pulmonary Disease

## 2017-12-21 NOTE — Telephone Encounter (Signed)
Last seen 03-2017-told to follow up in 6 months- no apt made-can send in 1 time for Trelegy inhaler and patient to be seen for further  Refills.  LMTCB

## 2017-12-22 NOTE — Telephone Encounter (Signed)
Patient returned phone call. °

## 2017-12-22 NOTE — Telephone Encounter (Signed)
Called and spoke with pt letting her know that we could send 1 inhaler Trelegy to pharmacy but stated to her before we sent any more RF we would need her to come in for an appt due to her last OV being in March 2019. Pt stated that she did not know that she had no f/u scheduled. I did schedule an ROV for pt with Dr. Vaughan Browner 1/23. Pt stated that her PCP is going to take over refilling her trelegy for her as they filled it yesterday, 12/18 for her. Nothing further needed.

## 2017-12-23 ENCOUNTER — Other Ambulatory Visit: Payer: Self-pay

## 2017-12-23 MED ORDER — FLUTICASONE-UMECLIDIN-VILANT 100-62.5-25 MCG/INH IN AEPB: 1.0000 | INHALATION_SPRAY | Freq: Every day | RESPIRATORY_TRACT | 1 refills | Status: AC

## 2017-12-23 NOTE — Progress Notes (Signed)
Received Rx request from [facility] for Trelegy. Pt last seen 03/14/17 with pending ov for 01/26/18. Pt was instructed to continue Trelegy at last OV.  Rx for Trelegy sent to preferred pharmacy.  Nothing further is needed.

## 2018-01-02 ENCOUNTER — Ambulatory Visit: Payer: Medicare Other | Admitting: Sports Medicine

## 2018-01-03 ENCOUNTER — Encounter: Payer: Self-pay | Admitting: Cardiology

## 2018-01-03 NOTE — Progress Notes (Signed)
[place] Date of Birth: Nov 29, 1940 Medical Record #[id number]  History of Present Illness: Sherri King is seen for follow up coronary vasospasm.  Last seen 2 years ago. She has a history of  tobacco abuse, vasospastic CAD with last cath in 2009 showing an area of narrowing in the mid LAD that resolved with NTG. She was previously on nitrate therapy but this was stopped over 5 years ago. Other issues include HTN and HLD. When last seen in 2016 she had quit smoking and was off all cardiac meds. She is followed by pulmonary for COPD.   On follow up today she notes she has some intermittent pain in her arms and some in the central chest. This lasts less than one minute. No clear triggers. States she is just tense. Doesn't have sl Ntg to try. Dyspnea is stable.  Current Outpatient Medications  Medication Sig Dispense Refill  . albuterol (PROVENTIL HFA;VENTOLIN HFA) 108 (90 BASE) MCG/ACT inhaler Inhale into the lungs every 6 (six) hours as needed for wheezing or shortness of breath.    . carboxymethylcellulose (REFRESH PLUS) 0.5 % SOLN Place 1 drop into both eyes daily.    . cholecalciferol (VITAMIN D) 1000 units tablet Take 1,000 Units by mouth daily.    Marland Kitchen FORTEO 600 MCG/2.4ML SOLN     . Multiple Vitamins-Minerals (MULTIVITAMIN WITH MINERALS) tablet Take 1 tablet by mouth daily.    . Omeprazole 20 MG TBEC Take 1 tablet by mouth daily.  0  . sertraline (ZOLOFT) 50 MG tablet 1/2 TABLET FOR 4 DAYS THEN 1 EACH MORNING ONCE A DAY ORALLY 90 DAYS  4  . traMADol (ULTRAM) 50 MG tablet TAKE 1 TABLET EVERY 6 HOURS FOR 20 DAYS, THEN TAKE 1 TABLET 3 TIMES A DAY AS NEEDED  0  . acetaminophen (TYLENOL) 500 MG tablet Take 500 mg by mouth every 6 (six) hours as needed.    . nitroGLYCERIN (NITROSTAT) 0.4 MG SL tablet Place 1 tablet (0.4 mg total) under the tongue every 5 (five) minutes as needed for chest pain. 25 tablet 3   No current facility-administered medications for this visit.     Allergies    Allergen Reactions  . Bee Venom Diarrhea, Nausea And Vomiting and Swelling    Past Medical History:  Diagnosis Date  . Anxiety   . Arthritis   . Cancer ([place])    cervical  . COPD (chronic obstructive pulmonary disease) ([place])   . Coronary vasospasm ([place])   . Depression   . GERD (gastroesophageal reflux disease)   . Hyperlipidemia   . Hypertension   . Multiple thyroid nodules     Past Surgical History:  Procedure Laterality Date  . ABDOMINAL HYSTERECTOMY     due to cancer  . BREAST SURGERY Bilateral 2016   removal of several nodules from right breast  . RADIOACTIVE SEED GUIDED EXCISIONAL BREAST BIOPSY Left 10/31/2014   Procedure: RADIOACTIVE SEED GUIDED EXCISIONAL LEFT BREAST BIOPSY;  Surgeon: Rolm Bookbinder, MD;  Location: [facility];  Service: General;  Laterality: Left;  . THYROIDECTOMY, PARTIAL    . TUBAL LIGATION    . VESICOVAGINAL FISTULA CLOSURE W/ TAH      Social History   Tobacco Use  Smoking Status Former Smoker  . Packs/day: 1.00  . Years: 60.00  . Pack years: 60.00  . Types: Cigarettes  . Last attempt to quit: 04/28/2014  . Years since quitting: 3.6  Smokeless Tobacco Never Used    Social History  Substance and Sexual Activity  Alcohol Use Yes  . Alcohol/week: 0.0 standard drinks   Comment: occasional beer    Family History  Problem Relation Age of Onset  . Heart failure Mother   . Stroke Mother   . Cancer Father        lung  . Cancer Sister        lung, mets to brain  . Cancer Sister        lung  . Heart disease Sister   . Allergies Sister        all 8 sisters  . Cancer Other        bone    Review of Systems: The review of systems is per the HPI. All other systems were reviewed and are negative.  Physical Exam: BP 130/70   Pulse (!) 59   Ht 5\' 4"  (1.626 m)   Wt 174 lb (78.9 kg)   BMI 29.87 kg/m  GENERAL:  Well appearing overweight WF in NAD HEENT:  PERRL, EOMI, sclera are clear. Oropharynx is clear. NECK:   No jugular venous distention, carotid upstroke brisk and symmetric, no bruits, no thyromegaly or adenopathy LUNGS:  Clear to auscultation bilaterally CHEST:  Unremarkable HEART:  RRR,  PMI not displaced or sustained,S1 and S2 within normal limits, no S3, no S4: no clicks, no rubs, no murmurs ABD:  Soft, nontender. BS +, no masses or bruits. No hepatomegaly, no splenomegaly EXT:  2 + pulses throughout, no edema, no cyanosis no clubbing SKIN:  Warm and dry.  No rashes NEURO:  Alert and oriented x 3. Cranial nerves II through XII intact. PSYCH:  Cognitively intact    Wt Readings from Last 3 Encounters:  01/05/18 174 lb (78.9 kg)  11/29/17 177 lb (80.3 kg)  10/18/17 170 lb (77.1 kg)     LABORATORY DATA:  Lab Results  Component Value Date   WBC 10.5 12/07/2016   HGB 14.3 12/07/2016   HCT 43.9 12/07/2016   PLT 200.0 12/07/2016   GLUCOSE 110 (H) 10/28/2014   NA 141 10/28/2014   K 4.5 10/28/2014   CL 109 10/28/2014   CREATININE 0.52 10/28/2014   BUN 11 10/28/2014   CO2 27 10/28/2014    Dated 04/19/16: cholesterol 158, triglycerides 84, HDL 39, LDL 102. CMET normal. Dated 04/27/17: cholesterol 166, triglycerides 81, HDL 48, LDL 102. CBC and CMET normal.   Ecg today shows NSR with normal Ecg. Rate 59. I have personally reviewed and interpreted this study.   Assessment / Plan:  1. Vasospastic CAD - having some atypical and transient symptoms.   I think the most important thing is that she quit smoking.  I have renewed her sl Ntg prescription for her to use as needed. She will call if symptoms progress.   2. Tobacco abuse - now stopped.  3. HTN - controlled.   4. HLD - mildly elevated. Work on lifestyle modification.  5. COPD per pulmonary.   See back in a year.

## 2018-01-05 ENCOUNTER — Ambulatory Visit: Payer: Medicare Other | Admitting: Cardiology

## 2018-01-05 ENCOUNTER — Encounter (INDEPENDENT_AMBULATORY_CARE_PROVIDER_SITE_OTHER): Payer: Self-pay

## 2018-01-05 ENCOUNTER — Encounter: Payer: Self-pay | Admitting: Cardiology

## 2018-01-05 VITALS — BP 130/70 | HR 59 | Ht 64.0 in | Wt 174.0 lb

## 2018-01-05 DIAGNOSIS — I201 Angina pectoris with documented spasm: Secondary | ICD-10-CM

## 2018-01-05 DIAGNOSIS — I10 Essential (primary) hypertension: Secondary | ICD-10-CM | POA: Diagnosis not present

## 2018-01-05 MED ORDER — NITROGLYCERIN 0.4 MG SL SUBL: 0.4000 mg | SUBLINGUAL_TABLET | SUBLINGUAL | 3 refills | Status: DC | PRN

## 2018-01-26 ENCOUNTER — Ambulatory Visit: Payer: Medicare Other | Admitting: Pulmonary Disease

## 2018-03-23 ENCOUNTER — Other Ambulatory Visit: Payer: Self-pay | Admitting: Pulmonary Disease

## 2018-03-30 ENCOUNTER — Other Ambulatory Visit: Payer: Self-pay | Admitting: Pulmonary Disease

## 2018-03-31 ENCOUNTER — Other Ambulatory Visit: Payer: Self-pay | Admitting: Pulmonary Disease

## 2018-04-04 ENCOUNTER — Emergency Department (HOSPITAL_COMMUNITY): Payer: Medicare Other

## 2018-04-04 ENCOUNTER — Encounter (HOSPITAL_COMMUNITY): Payer: Self-pay | Admitting: Emergency Medicine

## 2018-04-04 ENCOUNTER — Emergency Department (HOSPITAL_COMMUNITY)
Admission: EM | Admit: 2018-04-04 | Discharge: 2018-04-04 | Disposition: A | Discharge status: Home / Self Care | Payer: Medicare Other | Attending: Emergency Medicine | Admitting: Emergency Medicine

## 2018-04-04 ENCOUNTER — Other Ambulatory Visit: Payer: Self-pay

## 2018-04-04 DIAGNOSIS — R1013 Epigastric pain: Secondary | ICD-10-CM | POA: Diagnosis not present

## 2018-04-04 DIAGNOSIS — I10 Essential (primary) hypertension: Secondary | ICD-10-CM | POA: Diagnosis not present

## 2018-04-04 DIAGNOSIS — Z79899 Other long term (current) drug therapy: Secondary | ICD-10-CM | POA: Diagnosis not present

## 2018-04-04 DIAGNOSIS — R740 Nonspecific elevation of levels of transaminase and lactic acid dehydrogenase [LDH]: Secondary | ICD-10-CM | POA: Insufficient documentation

## 2018-04-04 DIAGNOSIS — Z87891 Personal history of nicotine dependence: Secondary | ICD-10-CM | POA: Diagnosis not present

## 2018-04-04 DIAGNOSIS — R112 Nausea with vomiting, unspecified: Secondary | ICD-10-CM

## 2018-04-04 DIAGNOSIS — Z7982 Long term (current) use of aspirin: Secondary | ICD-10-CM | POA: Diagnosis not present

## 2018-04-04 DIAGNOSIS — J449 Chronic obstructive pulmonary disease, unspecified: Secondary | ICD-10-CM | POA: Diagnosis not present

## 2018-04-04 DIAGNOSIS — R7401 Elevation of levels of liver transaminase levels: Secondary | ICD-10-CM

## 2018-04-04 LAB — COMPREHENSIVE METABOLIC PANEL
ALK PHOS: 115 U/L (ref 38–126)
ALT: 76 U/L — ABNORMAL HIGH (ref 0–44)
ANION GAP: 12 (ref 5–15)
AST: 143 U/L — ABNORMAL HIGH (ref 15–41)
Albumin: 3.9 g/dL (ref 3.5–5.0)
BILIRUBIN TOTAL: 1.3 mg/dL — AB (ref 0.3–1.2)
BUN: 13 mg/dL (ref 8–23)
CO2: 21 mmol/L — ABNORMAL LOW (ref 22–32)
Calcium: 8.9 mg/dL (ref 8.9–10.3)
Chloride: 106 mmol/L (ref 98–111)
Creatinine, Ser: 0.64 mg/dL (ref 0.44–1.00)
GLUCOSE: 141 mg/dL — AB (ref 70–99)
POTASSIUM: 3.4 mmol/L — AB (ref 3.5–5.1)
Sodium: 139 mmol/L (ref 135–145)
TOTAL PROTEIN: 6.6 g/dL (ref 6.5–8.1)

## 2018-04-04 LAB — CBC
HCT: 43.5 % (ref 36.0–46.0)
HEMOGLOBIN: 14 g/dL (ref 12.0–15.0)
MCH: 30.2 pg (ref 26.0–34.0)
MCHC: 32.2 g/dL (ref 30.0–36.0)
MCV: 93.8 fL (ref 80.0–100.0)
NRBC: 0 % (ref 0.0–0.2)
Platelets: 180 10*3/uL (ref 150–400)
RBC: 4.64 MIL/uL (ref 3.87–5.11)
RDW: 13.2 % (ref 11.5–15.5)
WBC: 13.7 10*3/uL — AB (ref 4.0–10.5)

## 2018-04-04 LAB — URINALYSIS, ROUTINE W REFLEX MICROSCOPIC
Bilirubin Urine: NEGATIVE
Glucose, UA: NEGATIVE mg/dL
Hgb urine dipstick: NEGATIVE
Ketones, ur: NEGATIVE mg/dL
LEUKOCYTE UA: NEGATIVE
NITRITE: NEGATIVE
Protein, ur: NEGATIVE mg/dL
SPECIFIC GRAVITY, URINE: 1.015 (ref 1.005–1.030)
pH: 6 (ref 5.0–8.0)

## 2018-04-04 LAB — LIPASE, BLOOD: Lipase: 26 U/L (ref 11–51)

## 2018-04-04 LAB — TROPONIN I

## 2018-04-04 MED ORDER — ONDANSETRON HCL 4 MG/2ML IJ SOLN
4.0000 mg | Freq: Once | INTRAMUSCULAR | Status: AC
  Administered 2018-04-04: 4 mg via INTRAVENOUS
  Filled 2018-04-04: qty 2

## 2018-04-04 MED ORDER — FAMOTIDINE 20 MG IN NS 100 ML IVPB
20.0000 mg | Freq: Once | INTRAVENOUS | Status: AC
  Administered 2018-04-04: 20 mg via INTRAVENOUS
  Filled 2018-04-04: qty 100

## 2018-04-04 MED ORDER — ONDANSETRON 4 MG PO TBDP: 4.0000 mg | ORAL_TABLET | Freq: Three times a day (TID) | ORAL | 0 refills | Status: DC | PRN

## 2018-04-04 MED ORDER — MORPHINE SULFATE (PF) 4 MG/ML IV SOLN
4.0000 mg | Freq: Once | INTRAVENOUS | Status: AC
  Administered 2018-04-04: 4 mg via INTRAVENOUS
  Filled 2018-04-04: qty 1

## 2018-04-04 MED ORDER — MORPHINE SULFATE (PF) 4 MG/ML IV SOLN: 4.0000 mg | Freq: Once | INTRAVENOUS | Status: DC

## 2018-04-04 MED ORDER — OMEPRAZOLE 20 MG PO CPDR: 20.0000 mg | DELAYED_RELEASE_CAPSULE | Freq: Every day | ORAL | 0 refills | Status: AC

## 2018-04-04 MED ORDER — IOHEXOL 300 MG/ML  SOLN
100.0000 mL | Freq: Once | INTRAMUSCULAR | Status: AC | PRN
  Administered 2018-04-04: 100 mL via INTRAVENOUS

## 2018-04-04 NOTE — Discharge Instructions (Addendum)
Thank you for allowing me to care for you today in the Emergency Department.   Let one tablet of Zofran dissolve under your tongue every 6 hours as needed for nausea or vomiting.  Take one tablet of omeprazole daily for the next 2 weeks.   Call to schedule a follow up appointment with Dr. Watt Climes.  Return to the Emergency Department if you develop black or bloody stools or vomiting, a high fever, persistent vomiting despite taking Zofran, uncontrollable abdominal pain, or other new, concerning symptoms.

## 2018-04-04 NOTE — ED Notes (Signed)
Patient transported to X-ray 

## 2018-04-04 NOTE — ED Notes (Signed)
Patient transported to Ultrasound 

## 2018-04-04 NOTE — ED Triage Notes (Signed)
C/O of sharp epigastric pain with multiple emesis episodes. Pt states this occured once last week as well.

## 2018-04-04 NOTE — ED Notes (Signed)
RN offered morphine pt said she was her pain was ok, 2/10

## 2018-04-04 NOTE — ED Notes (Signed)
Pt returned to room  

## 2018-04-04 NOTE — ED Provider Notes (Signed)
[facility] EMERGENCY DEPARTMENT Provider Note   CSN: [id number] Arrival date & time: 04/04/18  0040    History   Chief Complaint Chief Complaint  Patient presents with   Emesis    HPI Sherri King is a 78 y.o. female with a history of PUD, vasospastic CAD, COPD, HTN, GERD, hyperlipidemia, and tobacco abuse in remission for 4 years who presents to the emergency department with a chief complaint of epigastric pain.  The patient endorses sudden onset epigastric pain that began at approximately 22:00.  Pain is constant, sharp, and radiates throughout her entire abdomen.  Pain is worse with laying flat.  She denies shortness of breath, but states " sometimes I just breathe really fast.  I tried to explain it to people before, but they just do not understand.  They told me I was breathing fast when I first got here, but it will feel like and breathing fast now."  She reports multiple episodes of nonbloody, nonbilious emesis since 2300.  She reports that she attempted to treat her symptoms with Tums with no improvement.  She denies fever, chills, cough, orthopnea, leg swelling, urinary symptoms, vaginal pain, discharge, or bleeding, diarrhea, constipation, melena, hematochezia, worse than baseline back pain, or neck pain, sinus, lightheadedness, or headache.  Surgical history includes a hysterectomy.  She reports that she was previously diagnosed with a peptic ulcer and states "I manage it naturally."  She also reports a history of congestive heart failure.    Cardiologist: Dr. Peter Martinique.  Last seen in January 2020.  She had a cardiac catheterization in 2009 that showed an area of narrowing in the mid LAD that resolved with nitroglycerin.  She is previously on nitrate therapy, but this was stopped over 5 years ago.  She was last seen in 2016 and had to quit smoking and was off all of her cardiac medications.  She is followed by pulmonology for COPD.     The  history is provided by the patient. No language interpreter was used.    Past Medical History:  Diagnosis Date   Anxiety    Arthritis    Cancer ([place])    cervical   COPD (chronic obstructive pulmonary disease) ([facility])    Coronary vasospasm ([facility])    Depression    GERD (gastroesophageal reflux disease)    Hyperlipidemia    Hypertension    Multiple thyroid nodules     Patient Active Problem List   Diagnosis Date Noted   COPD GOLD II with reversibility  10/26/2016   Papilloma of breast 10/08/2014   HLD (hyperlipidemia) 05/04/2013   Atypical chest pain 06/07/2011   Coronary vasospasm ([place]) 06/07/2011   Hypertension 06/07/2011   Tobacco abuse 06/07/2011    Past Surgical History:  Procedure Laterality Date   ABDOMINAL HYSTERECTOMY     due to cancer   BREAST SURGERY Bilateral 2016   removal of several nodules from right breast   RADIOACTIVE SEED GUIDED EXCISIONAL BREAST BIOPSY Left 10/31/2014   Procedure: RADIOACTIVE SEED GUIDED EXCISIONAL LEFT BREAST BIOPSY;  Surgeon: Rolm Bookbinder, MD;  Location: [facility];  Service: General;  Laterality: Left;   THYROIDECTOMY, PARTIAL     TUBAL LIGATION     VESICOVAGINAL FISTULA CLOSURE W/ TAH       OB History   No obstetric history on file.      Home Medications    Prior to Admission medications   Medication Sig Start Date End Date Taking? Authorizing Provider  acetaminophen (TYLENOL) 500 MG tablet Take 500 mg by mouth every 6 (six) hours as needed for mild pain.    Yes [provider]  albuterol (PROVENTIL HFA;VENTOLIN HFA) 108 (90 BASE) MCG/ACT inhaler Inhale 1-2 puffs into the lungs every 6 (six) hours as needed for wheezing or shortness of breath.    Yes [provider]  aspirin EC 81 MG tablet Take 81 mg by mouth daily.   Yes [provider]  carboxymethylcellulose (REFRESH PLUS) 0.5 % SOLN Place 1 drop into both eyes 2 (two) times daily as needed (dryness).     Yes [provider]  cholecalciferol (VITAMIN D) 1000 units tablet Take 1,000 Units by mouth daily.   Yes [provider]  FORTEO 600 MCG/2.4ML SOLN Inject 20 mcg into the skin daily.  08/16/17  Yes [provider]  Multiple Vitamins-Minerals (MULTIVITAMIN WITH MINERALS) tablet Take 1 tablet by mouth daily.   Yes [provider]  nitroGLYCERIN (NITROSTAT) 0.4 MG SL tablet Place 1 tablet (0.4 mg total) under the tongue every 5 (five) minutes as needed for chest pain. 01/05/18  Yes Martinique, Peter M, MD  sertraline (ZOLOFT) 50 MG tablet Take 50 mg by mouth daily as needed (anxiety).  07/29/17  Yes [provider]  traMADol (ULTRAM) 50 MG tablet Take 50-100 mg by mouth every 12 (twelve) hours as needed for moderate pain.  08/30/17  Yes [provider]  omeprazole (PRILOSEC) 20 MG capsule Take 1 capsule (20 mg total) by mouth daily for 14 days. 04/04/18 04/18/18  Lilith Solana A, PA-C  ondansetron (ZOFRAN ODT) 4 MG disintegrating tablet Take 1 tablet (4 mg total) by mouth every 8 (eight) hours as needed for nausea or vomiting. 04/04/18   Lutie Pickler A, PA-C    Family History Family History  Problem Relation Age of Onset   Heart failure Mother    Stroke Mother    Cancer Father        lung   Cancer Sister        lung, mets to brain   Cancer Sister        lung   Heart disease Sister    Allergies Sister        all 68 sisters   Cancer Other        bone    Social History Social History   Tobacco Use   Smoking status: Former Smoker    Packs/day: 1.00    Years: 60.00    Pack years: 60.00    Types: Cigarettes    Last attempt to quit: 04/28/2014    Years since quitting: 3.9   Smokeless tobacco: Never Used  Substance Use Topics   Alcohol use: Yes    Alcohol/week: 0.0 standard drinks    Comment: occasional beer   Drug use: No     Allergies   Bee venom   Review of Systems Review of Systems  Constitutional: Negative for  activity change, chills and fever.  Respiratory: Positive for shortness of breath.   Cardiovascular: Negative for chest pain, palpitations and leg swelling.  Gastrointestinal: Positive for abdominal pain, nausea and vomiting. Negative for anal bleeding, blood in stool, constipation, diarrhea and rectal pain.  Genitourinary: Negative for difficulty urinating, dysuria, flank pain, frequency, hematuria, urgency, vaginal bleeding, vaginal discharge and vaginal pain.  Musculoskeletal: Negative for back pain, gait problem, myalgias, neck pain and neck stiffness.  Skin: Negative for rash.  Allergic/Immunologic: Negative for immunocompromised state.  Neurological: Negative for dizziness, weakness,  numbness and headaches.  Psychiatric/Behavioral: Negative for confusion.     Physical Exam Updated Vital Signs BP (!) 159/74    Pulse 70    Temp 98.8 F (37.1 C)    Resp (!) 23    Ht 5\' 4"  (1.626 m)    Wt 79.4 kg    SpO2 98%    BMI 30.04 kg/m   Physical Exam Vitals signs and nursing note reviewed.  Constitutional:      General: She is not in acute distress. HENT:     Head: Normocephalic.  Eyes:     Conjunctiva/sclera: Conjunctivae normal.  Neck:     Musculoskeletal: Normal range of motion and neck supple.  Cardiovascular:     Rate and Rhythm: Normal rate and regular rhythm.     Heart sounds: No murmur. No friction rub. No gallop.   Pulmonary:     Effort: Pulmonary effort is normal. No respiratory distress.     Breath sounds: No stridor. No wheezing, rhonchi or rales.  Chest:     Chest wall: No tenderness.  Abdominal:     General: Bowel sounds are normal. There is no distension.     Palpations: Abdomen is soft. There is no mass.     Tenderness: There is abdominal tenderness. There is no right CVA tenderness, left CVA tenderness, guarding or rebound.     Hernia: No hernia is present.     Comments: Diffusely tender to palpation throughout the abdomen with maximal tenderness in the epigastric  region.  No rebound or guarding.  Abdomen soft, nondistended.  No peritoneal signs.  Skin:    General: Skin is warm.     Findings: No rash.  Neurological:     Mental Status: She is alert.  Psychiatric:        Behavior: Behavior normal.      ED Treatments / Results  Labs (all labs ordered are listed, but only abnormal results are displayed) Labs Reviewed  COMPREHENSIVE METABOLIC PANEL - Abnormal; Notable for the following components:      Result Value   Potassium 3.4 (*)    CO2 21 (*)    Glucose, Bld 141 (*)    AST 143 (*)    ALT 76 (*)    Total Bilirubin 1.3 (*)    All other components within normal limits  CBC - Abnormal; Notable for the following components:   WBC 13.7 (*)    All other components within normal limits  LIPASE, BLOOD  URINALYSIS, ROUTINE W REFLEX MICROSCOPIC  TROPONIN I    EKG EKG Interpretation  Date/Time:  Tuesday April 04 2018 02:25:49 EDT Ventricular Rate:  73 PR Interval:    QRS Duration: 91 QT Interval:  404 QTC Calculation: 446 R Axis:   40 Text Interpretation:  Sinus rhythm Probable left atrial enlargement Low voltage, precordial leads No significant change since last tracing Confirmed by Pryor Curia [phone]) on 04/04/2018 2:34:36 AM   Radiology Dg Chest 2 View  Result Date: 04/04/2018 CLINICAL DATA:  Sharp epigastric pain.  Multiple emesis episodes. EXAM: CHEST - 2 VIEW COMPARISON:  10/26/2016 FINDINGS: Normal heart size. Aortic atherosclerosis. No pleural effusion or edema. Small hiatal hernia. No airspace opacities. Scarring within the anterior right upper lobe. Spondylosis noted within the thoracic spine. Chronic compression deformity within the lower thoracic spine appears unchanged. IMPRESSION: 1. No acute cardiopulmonary abnormalities. Electronically Signed   By: Kerby Moors M.D.   On: 04/04/2018 01:45   Ct Abdomen Pelvis W Contrast  Result  Date: 04/04/2018 CLINICAL DATA:  Epigastric pain with emesis. EXAM: CT ABDOMEN AND PELVIS  WITH CONTRAST TECHNIQUE: Multidetector CT imaging of the abdomen and pelvis was performed using the standard protocol following bolus administration of intravenous contrast. CONTRAST:  143mL OMNIPAQUE IOHEXOL 300 MG/ML  SOLN COMPARISON:  11/19/9 FINDINGS: Lower chest: No acute abnormality. Hepatobiliary: Unchanged cyst in segment 4b measuring 5 mm. Cyst within the central aspect of the liver measures 1.3 cm. Previously 1.2 cm. Gallbladder appears normal. Mild increase caliber of the proximal common bile duct measures 9 mm. Pancreas: Unremarkable. No pancreatic ductal dilatation or surrounding inflammatory changes. Spleen: Normal in size without focal abnormality. Adrenals/Urinary Tract: Normal appearance of the adrenal glands. There is mild rotation of the kidneys. Stone within the inferior pole of left kidney measures 5 mm. Left upper pole cyst measures 1.5 cm. Several additional bilateral milli metric low-density foci within both kidneys are too small to characterize. No hydronephrosis or ureteral lithiasis. Urinary bladder appears normal. Stomach/Bowel: Small hiatal hernia. Normal caliber of the small bowel loops. The appendix is visualized and appears normal. Colonic diverticulosis without acute inflammation. Vascular/Lymphatic: Aortic atherosclerosis. No aneurysm. No abdominopelvic adenopathy identified. Reproductive: Status post hysterectomy. No adnexal masses. Other: No free fluid or fluid collections. Degenerative disc disease within the lumbar spine. Musculoskeletal: No acute or significant osseous findings. Chronic L1 compression deformity unchanged from MRI dated 01/22/2015. IMPRESSION: 1. No acute findings within the abdomen or pelvis. 2. Nonobstructing left renal calculus. 3.  Aortic Atherosclerosis (ICD10-I70.0). 4. Chronic L1 compression fracture. Electronically Signed   By: Kerby Moors M.D.   On: 04/04/2018 03:35   US Abdomen Limited Ruq  Result Date: 04/04/2018 CLINICAL DATA:  Elevated  transaminase. EXAM: ULTRASOUND ABDOMEN LIMITED RIGHT UPPER QUADRANT COMPARISON:  Abdominal ultrasound April 18, 2015 FINDINGS: Gallbladder: No gallstones or wall thickening visualized. No sonographic Murphy sign noted by sonographer. Common bile duct: Diameter: 6 mm Liver: No focal lesion identified. Increased parenchymal echogenicity. Portal vein is patent on color Doppler imaging with normal direction of blood flow towards the liver. IMPRESSION: 1. Hepatic steatosis/hepatocellular disease. Electronically Signed   By: Elon Alas M.D.   On: 04/04/2018 02:53    Procedures Procedures (including critical care time)  Medications Ordered in ED Medications  morphine 4 MG/ML injection 4 mg (4 mg Intravenous Not Given 04/04/18 0245)  morphine 4 MG/ML injection 4 mg (4 mg Intravenous Given 04/04/18 0142)  ondansetron (ZOFRAN) injection 4 mg (4 mg Intravenous Given 04/04/18 0145)  iohexol (OMNIPAQUE) 300 MG/ML solution 100 mL (100 mLs Intravenous Contrast Given 04/04/18 0317)  famotidine (PEPCID) IVPB 20 mg in NS 100 mL IVPB (0 mg Intravenous Stopped 04/04/18 0438)     Initial Impression / Assessment and Plan / ED Course  I have reviewed the triage vital signs and the nursing notes.  Pertinent labs & imaging results that were available during my care of the patient were reviewed by me and considered in my medical decision making (see chart for details).        78 year old female with a history of PUD, vasospastic CAD, COPD, HTN, GERD, hyperlipidemia, and tobacco abuse in remission for 4 years presenting with abdominal pain, nausea, and vomiting, onset tonight.  No constitutional symptoms.  Differential diagnosis includes cholecystitis, bleeding ulcer, gastritis, pancreatitis, diverticulitis, mesenteric ischemia.  Vital signs are reassuring.  Patient was discussed and independently evaluated by Dr. Leonides Schanz, attending physician.  Labs with minimal hypokalemia of 3.4.  Bicarb is minimally decreased at 21.   AST, ALT,  and total bilirubin are all acutely elevated.  The patient does report she has a history of hepatic steatosis, but these labs do appear acutely elevated as compared to previous labs.  She also has a leukocytosis of 13.7.  Given concern for atypical presentation of ACS, troponin was also ordered which was negative and EKG appears unchanged from previous.  Will order right upper quadrant ultrasound for further assessment of elevated transaminases and total bilirubin.  The patient reports significant provement in her pain after morphine and Zofran.  Right upper quadrant ultrasound is unremarkable.  Given that she did have diffuse tenderness, CT abdomen pelvis was also ordered and was unremarkable.  These findings were discussed with the patient.  She was given a dose of Pepcid in the ER and was successfully fluid challenge.  She is established with gastroenterology so I will discharge her with omeprazole and Zofran and have advised her to follow-up with gastroenterology.  She is hemodynamically stable and in no acute distress.  Safe for discharge home with outpatient follow-up at this time.  Final Clinical Impressions(s) / ED Diagnoses   Final diagnoses:  Elevated transaminase level  Epigastric abdominal pain  Non-intractable vomiting with nausea, unspecified vomiting type    ED Discharge Orders         Ordered    ondansetron (ZOFRAN ODT) 4 MG disintegrating tablet  Every 8 hours PRN     04/04/18 0418    omeprazole (PRILOSEC) 20 MG capsule  Daily     04/04/18 0418           Joanne Gavel, PA-C 04/04/18 0631    Ward, Delice Bison, DO 04/04/18 [phone]

## 2018-05-31 ENCOUNTER — Telehealth: Payer: Self-pay | Admitting: Pulmonary Disease

## 2018-05-31 NOTE — Telephone Encounter (Signed)
Returned call to patient.  No answer.  Left voicemail for her to call us.

## 2018-06-01 NOTE — Telephone Encounter (Signed)
Spoke with patient. Advised her that I did not see where anyone from this office had called her. She verbalized understanding. Nothing further needed at time of call.

## 2018-06-01 NOTE — Telephone Encounter (Signed)
Pt is calling back [phone]

## 2018-06-01 NOTE — Telephone Encounter (Signed)
ATC Patient.  LMTCB. 

## 2018-07-12 ENCOUNTER — Telehealth: Payer: Self-pay | Admitting: Pulmonary Disease

## 2018-07-12 DIAGNOSIS — Z122 Encounter for screening for malignant neoplasm of respiratory organs: Secondary | ICD-10-CM

## 2018-07-12 DIAGNOSIS — Z87891 Personal history of nicotine dependence: Secondary | ICD-10-CM

## 2018-07-12 NOTE — Telephone Encounter (Signed)
Called and spoke with pt. Pt stated she had missed a call from [facility]. I saw that pt does have a referral from Dr. Shelia Media, PCP for a referral to lung cancer screening program.  Stated to pt that I would send this to [facility] and she would call pt back when able and pt verbalized understanding.

## 2018-07-14 NOTE — Telephone Encounter (Signed)
Spoke with pt and scheduled SDMV 08/14/18 2:00 CT ordered Nothing further needed

## 2018-08-14 ENCOUNTER — Ambulatory Visit (INDEPENDENT_AMBULATORY_CARE_PROVIDER_SITE_OTHER): Payer: Medicare Other | Admitting: Acute Care

## 2018-08-14 ENCOUNTER — Encounter: Payer: Self-pay | Admitting: Acute Care

## 2018-08-14 ENCOUNTER — Ambulatory Visit (INDEPENDENT_AMBULATORY_CARE_PROVIDER_SITE_OTHER)
Admission: RE | Admit: 2018-08-14 | Discharge: 2018-08-14 | Disposition: A | Discharge status: Home / Self Care | Payer: Medicare Other | Source: Ambulatory Visit | Attending: Acute Care | Admitting: Acute Care

## 2018-08-14 ENCOUNTER — Other Ambulatory Visit: Payer: Self-pay

## 2018-08-14 VITALS — BP 124/78 | HR 67 | Temp 98.4°F | Ht 64.0 in | Wt 166.6 lb

## 2018-08-14 DIAGNOSIS — Z122 Encounter for screening for malignant neoplasm of respiratory organs: Secondary | ICD-10-CM

## 2018-08-14 DIAGNOSIS — Z87891 Personal history of nicotine dependence: Secondary | ICD-10-CM

## 2018-08-14 NOTE — Progress Notes (Signed)
Shared Decision Making Visit Lung Cancer Screening Program [phone])   Eligibility:  Age 78 y.o.  Pack Years Smoking History Calculation  (# packs/per year x # years smoked)  Recent History of coughing up blood  no  Unexplained weight loss? no ( >Than 15 pounds within the last 6 months )  Prior History Lung / other cancer no (Diagnosis within the last 5 years already requiring surveillance chest CT Scans).  Smoking Status Former Smoker  Former Smokers: Years since quit: 5 years  Quit Date: 2015  Visit Components:  Discussion included one or more decision making aids. yes  Discussion included risk/benefits of screening. yes  Discussion included potential follow up diagnostic testing for abnormal scans. yes  Discussion included meaning and risk of over diagnosis. yes  Discussion included meaning and risk of False Positives. yes  Discussion included meaning of total radiation exposure. yes  Counseling Included:  Importance of adherence to annual lung cancer LDCT screening. yes  Impact of comorbidities on ability to participate in the program. yes  Ability and willingness to under diagnostic treatment. yes  Smoking Cessation Counseling:  Current Smokers:   Discussed importance of smoking cessation. NA, former smoker  Information about tobacco cessation classes and interventions provided to patient. yes  Patient provided with "ticket" for LDCT Scan. yes  Symptomatic Patient. no  Counseling  Diagnosis Code: Tobacco Use Z72.0  Asymptomatic Patient yes  Counseling (Intermediate counseling: > three minutes counseling) [id number]  Former Smokers:   Discussed the importance of maintaining cigarette abstinence. yes  Diagnosis Code: Personal History of Nicotine Dependence. Z32.992  Information about tobacco cessation classes and interventions provided to patient. Yes  Patient provided with "ticket" for LDCT Scan. yes  Written Order for Lung Cancer Screening with  LDCT placed in Epic. Yes (CT Chest Lung Cancer Screening Low Dose W/O CM) [id number] Z12.2-Screening of respiratory organs Z87.891-Personal history of nicotine dependence  BP 124/78 (BP Location: Left Arm, Cuff Size: Normal)   Pulse 67   Temp 98.4 F (36.9 C) (Oral)   Ht 5\' 4"  (1.626 m)   Wt 166 lb 9.6 oz (75.6 kg)   SpO2 95%   BMI 28.60 kg/m    I have spent 25 minutes of face to face time with Sherri King discussing the risks and benefits of lung cancer screening. We viewed a power point together that explained in detail the above noted topics. We paused at intervals to allow for questions to be asked and answered to ensure understanding.We discussed that the single most powerful action that she can take to decrease her risk of developing lung cancer is to quit smoking. We discussed whether or not she is ready to commit to setting a quit date. We discussed options for tools to aid in quitting smoking including nicotine replacement therapy, non-nicotine medications, support groups, Quit Smart classes, and behavior modification. We discussed that often times setting smaller, more achievable goals, such as eliminating 1 cigarette a day for a week and then 2 cigarettes a day for a week can be helpful in slowly decreasing the number of cigarettes smoked. This allows for a sense of accomplishment as well as providing a clinical benefit. I gave her the " Be Stronger Than Your Excuses" card with contact information for community resources, classes, free nicotine replacement therapy, and access to mobile apps, text messaging, and on-line smoking cessation help. I have also given her my card and contact information in the event she needs to contact me. We discussed the time  and location of the scan, and that either Doroteo Glassman RN or I will call with the results within 24-48 hours of receiving them. I have offered her  a copy of the power point we viewed  as a resource in the event they need reinforcement of  the concepts we discussed today in the office. The patient verbalized understanding of all of  the above and had no further questions upon leaving the office. They have my contact information in the event they have any further questions.  I spent 3 minutes counseling on continued smoking cessation and the health risks of continued tobacco abuse.  I explained to the patient that there has been a high incidence of coronary artery disease noted on these exams. I explained that this is a non-gated exam therefore degree or severity cannot be determined. This patient is not on statin therapy. I have asked the patient to follow-up with their PCP regarding any incidental finding of coronary artery disease and management with diet or medication as their PCP  feels is clinically indicated. The patient verbalized understanding of the above and had no further questions upon completion of the visit.      Sherri Spatz, NP 08/14/2018 4:53 PM

## 2018-08-16 ENCOUNTER — Other Ambulatory Visit: Payer: Self-pay | Admitting: *Deleted

## 2018-08-16 DIAGNOSIS — Z87891 Personal history of nicotine dependence: Secondary | ICD-10-CM

## 2018-08-16 DIAGNOSIS — Z122 Encounter for screening for malignant neoplasm of respiratory organs: Secondary | ICD-10-CM

## 2018-09-19 ENCOUNTER — Ambulatory Visit: Payer: Medicare Other | Admitting: Diagnostic Neuroimaging

## 2018-09-26 ENCOUNTER — Encounter: Payer: Self-pay | Admitting: Diagnostic Neuroimaging

## 2018-09-26 ENCOUNTER — Other Ambulatory Visit: Payer: Self-pay

## 2018-09-26 ENCOUNTER — Ambulatory Visit (INDEPENDENT_AMBULATORY_CARE_PROVIDER_SITE_OTHER): Payer: Medicare Other | Admitting: Diagnostic Neuroimaging

## 2018-09-26 ENCOUNTER — Telehealth: Payer: Self-pay | Admitting: Diagnostic Neuroimaging

## 2018-09-26 VITALS — BP 120/84 | HR 64 | Temp 96.2°F | Ht 64.25 in | Wt 167.2 lb

## 2018-09-26 DIAGNOSIS — R200 Anesthesia of skin: Secondary | ICD-10-CM | POA: Diagnosis not present

## 2018-09-26 DIAGNOSIS — R269 Unspecified abnormalities of gait and mobility: Secondary | ICD-10-CM

## 2018-09-26 DIAGNOSIS — G3281 Cerebellar ataxia in diseases classified elsewhere: Secondary | ICD-10-CM

## 2018-09-26 DIAGNOSIS — R42 Dizziness and giddiness: Secondary | ICD-10-CM

## 2018-09-26 NOTE — Progress Notes (Signed)
[facility]  PATIENT: Sherri King DOB: June 20, 1940  REFERRING CLINICIAN: Audie Pinto HISTORY FROM: patient  REASON FOR VISIT: new consult    HISTORICAL  CHIEF COMPLAINT:  Chief Complaint  Patient presents with  . Gait Problem    rm 7 New Pt, "5 falls this year due to balance issues, legs not moving the same way"    HISTORY OF PRESENT ILLNESS:   78 year old female here for evaluation of gait difficulty.  Her past 1 year patient is had gradual onset progressive gait and balance difficulty.  Sometimes her legs give out.  Sometimes she feels dizzy and can fall down.  Sometimes she feels lightheaded as though she might pass out.  Patient has had more longstanding numbness and tingling in her toes.  She has bilateral knee arthritis.  She has some low back issues.  She has some hip problems.  She does not use a cane.  Patient has been to physical therapy for some evaluation and exercises.    REVIEW OF SYSTEMS: Full 14 system review of systems performed and negative with exception of: As per HPI.  ALLERGIES: Allergies  Allergen Reactions  . Bee Venom Diarrhea, Nausea And Vomiting and Swelling    HOME MEDICATIONS: Outpatient Medications Prior to Visit  Medication Sig Dispense Refill  . acetaminophen (TYLENOL) 500 MG tablet Take 500 mg by mouth every 6 (six) hours as needed for mild pain.     Marland Kitchen albuterol (PROVENTIL HFA;VENTOLIN HFA) 108 (90 BASE) MCG/ACT inhaler Inhale 1-2 puffs into the lungs every 6 (six) hours as needed for wheezing or shortness of breath.     Marland Kitchen aspirin EC 81 MG tablet Take 81 mg by mouth daily.    . carboxymethylcellulose (REFRESH PLUS) 0.5 % SOLN Place 1 drop into both eyes 2 (two) times daily as needed (dryness).     . cholecalciferol (VITAMIN D) 1000 units tablet Take 1,000 Units by mouth daily.    Marland Kitchen FORTEO 600 MCG/2.4ML SOLN Inject 20 mcg into the skin daily.     . Multiple Vitamins-Minerals (MULTIVITAMIN WITH MINERALS) tablet Take 1  tablet by mouth daily.    . nitroGLYCERIN (NITROSTAT) 0.4 MG SL tablet Place 1 tablet (0.4 mg total) under the tongue every 5 (five) minutes as needed for chest pain. 25 tablet 3  . omeprazole (PRILOSEC) 20 MG capsule Take 1 capsule (20 mg total) by mouth daily for 14 days. 14 capsule 0  . ondansetron (ZOFRAN ODT) 4 MG disintegrating tablet Take 1 tablet (4 mg total) by mouth every 8 (eight) hours as needed for nausea or vomiting. 20 tablet 0  . sertraline (ZOLOFT) 50 MG tablet Take 50 mg by mouth daily as needed (anxiety).   4  . traMADol (ULTRAM) 50 MG tablet Take 50-100 mg by mouth every 12 (twelve) hours as needed for moderate pain.   0  . TRELEGY ELLIPTA 100-62.5-25 MCG/INH AEPB INHALE 1 PUFF DAILY     No facility-administered medications prior to visit.     PAST MEDICAL HISTORY: Past Medical History:  Diagnosis Date  . Anxiety   . Arthritis   . Cancer ([place])    cervical  . COPD (chronic obstructive pulmonary disease) ([place])   . Coronary vasospasm ([place])   . Depression   . GERD (gastroesophageal reflux disease)   . Hyperlipidemia   . Hypertension   . Multiple thyroid nodules     PAST SURGICAL HISTORY: Past Surgical History:  Procedure Laterality Date  . ABDOMINAL HYSTERECTOMY  due to cancer  . BREAST SURGERY Bilateral 2016   removal of several nodules from right breast  . RADIOACTIVE SEED GUIDED EXCISIONAL BREAST BIOPSY Left 10/31/2014   Procedure: RADIOACTIVE SEED GUIDED EXCISIONAL LEFT BREAST BIOPSY;  Surgeon: Rolm Bookbinder, MD;  Location: [facility];  Service: General;  Laterality: Left;  . THYROIDECTOMY, PARTIAL    . TUBAL LIGATION    . VESICOVAGINAL FISTULA CLOSURE W/ TAH      FAMILY HISTORY: Family History  Problem Relation Age of Onset  . Heart failure Mother   . Stroke Mother   . Cancer Father        lung  . Cancer Sister        lung, mets to brain  . Cancer Sister        lung  . Heart disease Sister   . Allergies Sister        all  8 sisters  . Cancer Other        bone    SOCIAL HISTORY: Social History   Socioeconomic History  . Marital status: Widowed    Spouse name: Not on file  . Number of children: 3  . Years of education: Not on file  . Highest education level: GED or equivalent  Occupational History  . Occupation: BUS DRIVER    Employer: [employer]    Comment: retired  Scientific laboratory technician  . Financial resource strain: Not on file  . Food insecurity    Worry: Not on file    Inability: Not on file  . Transportation needs    Medical: Not on file    Non-medical: Not on file  Tobacco Use  . Smoking status: Former Smoker    Packs/day: 1.00    Years: 60.00    Pack years: 60.00    Types: Cigarettes    Quit date: 04/28/2014    Years since quitting: 4.4  . Smokeless tobacco: Never Used  Substance and Sexual Activity  . Alcohol use: Yes    Alcohol/week: 0.0 standard drinks    Comment: rarely a beer  . Drug use: No  . Sexual activity: Not on file  Lifestyle  . Physical activity    Days per week: Not on file    Minutes per session: Not on file  . Stress: Not on file  Relationships  . Social Herbalist on phone: Not on file    Gets together: Not on file    Attends religious service: Not on file    Active member of club or organization: Not on file    Attends meetings of clubs or organizations: Not on file    Relationship status: Not on file  . Intimate partner violence    Fear of current or ex partner: Not on file    Emotionally abused: Not on file    Physically abused: Not on file    Forced sexual activity: Not on file  Other Topics Concern  . Not on file  Social History Narrative   Caffeine: 1-2 cups per week   Exercise: 2x daily, 3-4 times per week   09/26/18 Widowed, lives with son   3 children   Retired - Arts development officer, drove a school bus   2 dogs   1 of 8 daughters, 1 brother who lives in [place]   No recent travel     [place]  EXAM/CONSTITUTIONAL: Vitals:  Vitals:   09/26/18 1424  BP: 120/84  Pulse: 64  Temp: (!) 96.2 F (35.7 C)  Weight: 167 lb 3.2 oz (75.8 kg)  Height: 5' 4.25" (1.632 m)     Body mass index is 28.48 kg/m. Wt Readings from Last 3 Encounters:  09/26/18 167 lb 3.2 oz (75.8 kg)  08/14/18 166 lb 9.6 oz (75.6 kg)  04/04/18 175 lb (79.4 kg)     Patient is in no distress; well developed, nourished and groomed; neck is supple  CARDIOVASCULAR:  Examination of carotid arteries is normal; no carotid bruits  Regular rate and rhythm, no murmurs  Examination of peripheral vascular system by observation and palpation is normal  EYES:  Ophthalmoscopic exam of optic discs and posterior segments is normal; no papilledema or hemorrhages  No exam data present  MUSCULOSKELETAL:  Gait, strength, tone, movements noted in Neurologic exam below  NEUROLOGIC: MENTAL STATUS:  No flowsheet data found.  awake, alert, oriented to person, place and time  recent and remote memory intact  normal attention and concentration  language fluent, comprehension intact, naming intact  fund of knowledge appropriate  CRANIAL NERVE:   2nd - no papilledema on fundoscopic exam  2nd, 3rd, 4th, 6th - pupils equal and reactive to light, visual fields full to confrontation, extraocular muscles intact, no nystagmus  5th - facial sensation symmetric  7th - facial strength symmetric  8th - hearing intact  9th - palate elevates symmetrically, uvula midline  11th - shoulder shrug symmetric  12th - tongue protrusion midline  MOTOR:   normal bulk and tone, full strength in the BUE, BLE; EXCEPT BILATERAL HIP FLEXION 3-4  SENSORY:   normal and symmetric to light touch; DECR IN LEGS / FEET  COORDINATION:   finger-nose-finger, fine finger movements SLOW  REFLEXES:   deep tendon reflexes present and symmetric  GAIT/STATION:   WIDE GAIT; SHORT STEPS; UNSTEADY; LIMPING GAIT     DIAGNOSTIC  DATA (LABS, IMAGING, TESTING) - I reviewed patient records, labs, notes, testing and imaging myself where available.  Lab Results  Component Value Date   WBC 13.7 (H) 04/04/2018   HGB 14.0 04/04/2018   HCT 43.5 04/04/2018   MCV 93.8 04/04/2018   PLT 180 04/04/2018      Component Value Date/Time   NA 139 04/04/2018 0110   K 3.4 (L) 04/04/2018 0110   CL 106 04/04/2018 0110   CO2 21 (L) 04/04/2018 0110   GLUCOSE 141 (H) 04/04/2018 0110   BUN 13 04/04/2018 0110   CREATININE 0.64 04/04/2018 0110   CALCIUM 8.9 04/04/2018 0110   PROT 6.6 04/04/2018 0110   ALBUMIN 3.9 04/04/2018 0110   AST 143 (H) 04/04/2018 0110   ALT 76 (H) 04/04/2018 0110   ALKPHOS 115 04/04/2018 0110   BILITOT 1.3 (H) 04/04/2018 0110   GFRNONAA >60 04/04/2018 0110   GFRAA >60 04/04/2018 0110   No results found for: CHOL, HDL, LDLCALC, LDLDIRECT, TRIG, CHOLHDL No results found for: HGBA1C No results found for: VITAMINB12 No results found for: TSH     ASSESSMENT AND PLAN  78 y.o. year old female here with gait and balance difficulty for past 6 to 12 months, likely combination of arthritis, deconditioning, with some signs for neuropathy and myopathy.  Will check MRI of the brain to rule out stroke or other central nervous system causes.  We will check some labs.  Advised to use a cane or walker.  Continue physical therapy exercise.   DDx: stroke, myopathy, neuropathy, arthritis, deconditioning  1. Dizziness   2. Gait difficulty  3. Numbness in feet   4. Cerebellar ataxia in diseases classified elsewhere [facility])     PLAN:  GAIT DIFFICULTY (ataxia, falls) - check MRI brain - check labs - use cane - continue PT exercises  Orders Placed This Encounter  Procedures  . MR BRAIN W WO CONTRAST  . Hemoglobin A1c  . Vitamin B12  . CK  . Aldolase   Return pending test results, for pending if symptoms worsen or fail to improve.    Penni Bombard, MD [id number], [id number] PM Certified in Neurology,  Neurophysiology and Neuroimaging  [facility] [address], [place], [place] [address] [phone]

## 2018-09-26 NOTE — Patient Instructions (Signed)
BALANCE DIFFICULTY   - check MRI brain  - check labs  - use cane  - continue PT exercises

## 2018-09-26 NOTE — Telephone Encounter (Signed)
[employer] medicare order sent to GI. No auth they will reach out to the patient to schedule.  

## 2018-09-27 LAB — VITAMIN B12: Vitamin B-12: 484 pg/mL (ref 232–1245)

## 2018-09-27 LAB — HEMOGLOBIN A1C
Est. average glucose Bld gHb Est-mCnc: 128 mg/dL
Hgb A1c MFr Bld: 6.1 % — ABNORMAL HIGH (ref 4.8–5.6)

## 2018-09-27 LAB — CK: Total CK: 55 U/L (ref 32–182)

## 2018-09-27 LAB — ALDOLASE: Aldolase: 7.1 U/L (ref 3.3–10.3)

## 2018-09-28 ENCOUNTER — Telehealth: Payer: Self-pay | Admitting: *Deleted

## 2018-09-28 NOTE — Telephone Encounter (Signed)
Spoke with patient and informed her that her labs are okay except A1C is 6.1. She stated her PCP watches it, and I advised she let him know as her last one was "borderline" per PCP. Her MRI is 10/13, and I advised she'll get a call several days after with those results. Patient verbalized understanding, appreciation.

## 2018-10-17 ENCOUNTER — Ambulatory Visit
Admission: RE | Admit: 2018-10-17 | Discharge: 2018-10-17 | Disposition: A | Discharge status: Home / Self Care | Payer: Medicare Other | Source: Ambulatory Visit | Attending: Diagnostic Neuroimaging | Admitting: Diagnostic Neuroimaging

## 2018-10-17 ENCOUNTER — Other Ambulatory Visit: Payer: Self-pay

## 2018-10-17 DIAGNOSIS — G3281 Cerebellar ataxia in diseases classified elsewhere: Secondary | ICD-10-CM

## 2018-10-17 MED ORDER — GADOBENATE DIMEGLUMINE 529 MG/ML IV SOLN
15.0000 mL | Freq: Once | INTRAVENOUS | Status: AC | PRN
  Administered 2018-10-17: 15 mL via INTRAVENOUS

## 2018-10-19 ENCOUNTER — Telehealth: Payer: Self-pay | Admitting: *Deleted

## 2018-10-19 NOTE — Telephone Encounter (Signed)
Called patient and informed her that her MRI brain result showed no major findings. She  verbalized understanding, appreciation.

## 2019-01-10 DIAGNOSIS — M25511 Pain in right shoulder: Secondary | ICD-10-CM | POA: Diagnosis not present

## 2019-01-10 DIAGNOSIS — G8929 Other chronic pain: Secondary | ICD-10-CM | POA: Diagnosis not present

## 2019-02-13 ENCOUNTER — Telehealth: Payer: Self-pay

## 2019-02-13 ENCOUNTER — Encounter: Payer: Self-pay | Admitting: Physician Assistant

## 2019-02-13 ENCOUNTER — Telehealth (INDEPENDENT_AMBULATORY_CARE_PROVIDER_SITE_OTHER): Payer: Medicare Other | Admitting: Physician Assistant

## 2019-02-13 VITALS — BP 114/64 | HR 64 | Ht 64.0 in | Wt 170.0 lb

## 2019-02-13 DIAGNOSIS — I10 Essential (primary) hypertension: Secondary | ICD-10-CM

## 2019-02-13 DIAGNOSIS — I201 Angina pectoris with documented spasm: Secondary | ICD-10-CM

## 2019-02-13 DIAGNOSIS — E7800 Pure hypercholesterolemia, unspecified: Secondary | ICD-10-CM

## 2019-02-13 DIAGNOSIS — J449 Chronic obstructive pulmonary disease, unspecified: Secondary | ICD-10-CM

## 2019-02-13 NOTE — Progress Notes (Signed)
Virtual Visit via Telephone Note   This visit type was conducted due to national recommendations for restrictions regarding the COVID-19 Pandemic (e.g. social distancing) in an effort to limit this patient's exposure and mitigate transmission in our community.  Due to her co-morbid illnesses, this patient is at least at moderate risk for complications without adequate follow up.  This format is felt to be most appropriate for this patient at this time.  The patient did not have access to video technology/had technical difficulties with video requiring transitioning to audio format only (telephone).  All issues noted in this document were discussed and addressed.  No physical exam could be performed with this format.  Please refer to the patient's chart for her  consent to telehealth for [facility].   Date:  02/13/2019   ID:  Sherri King, DOB [birth year], MRN [id number]  Patient Location: Home Provider Location: Office  PCP:  Deland Pretty, MD  Cardiologist:  Peter Martinique, MD  Electrophysiologist:  None   Evaluation Performed:  Follow-Up Visit  Chief Complaint:  followup  History of Present Illness:    [name] is a 79 y.o. female with past medical history of COPD, coronary vasospasm, former tobacco abuse, hypertension, hyperlipidemia and GERD.  Previous cardiac catheterization in 2009 showed an area of narrowing in the mid LAD that resolved with nitroglycerin.  She was previously on nitrate therapy, this was later discontinued given lack of recurrence.  Patient was last seen by Dr. Martinique in January 2020 at which time she complained of some very atypical intermittent arm pain.  She was instructed to use nitroglycerin on a as needed basis.  Patient was contacted today via virtual visit.  She denies any recent chest pain or shortness of breath.  She was seen by neurologist in September 2020 for dizziness, MRI of the brain was negative for acute process.  Dizziness has  resolved.  She does not have any lower extremity edema, orthopnea or PND.  She says her lipid panel has been followed closely by Dr. Shelia Media.  She is managing her high cholesterol through diet and exercise only.  Overall she is doing quite well from cardiology perspective.  She may follow-up in 1 year.  She is interested in a COVID-19 vaccine.  The patient does not have symptoms concerning for COVID-19 infection (fever, chills, cough, or new shortness of breath).    Past Medical History:  Diagnosis Date  . Anxiety   . Arthritis   . Cancer ([place])    cervical  . COPD (chronic obstructive pulmonary disease) ([place])   . Coronary vasospasm ([place])   . Depression   . GERD (gastroesophageal reflux disease)   . Hyperlipidemia   . Hypertension   . Multiple thyroid nodules    Past Surgical History:  Procedure Laterality Date  . ABDOMINAL HYSTERECTOMY     due to cancer  . BREAST SURGERY Bilateral 2016   removal of several nodules from right breast  . RADIOACTIVE SEED GUIDED EXCISIONAL BREAST BIOPSY Left 10/31/2014   Procedure: RADIOACTIVE SEED GUIDED EXCISIONAL LEFT BREAST BIOPSY;  Surgeon: Rolm Bookbinder, MD;  Location: [facility];  Service: General;  Laterality: Left;  . THYROIDECTOMY, PARTIAL    . TUBAL LIGATION    . VESICOVAGINAL FISTULA CLOSURE W/ TAH       Current Meds  Medication Sig  . aspirin EC 81 MG tablet Take 81 mg by mouth daily.  . carboxymethylcellulose (REFRESH PLUS) 0.5 % SOLN Place 1 drop  into both eyes 2 (two) times daily as needed (dryness).   . cholecalciferol (VITAMIN D) 1000 units tablet Take 1,000 Units by mouth daily.  . Multiple Vitamins-Minerals (MULTIVITAMIN WITH MINERALS) tablet Take 1 tablet by mouth daily.  . nitroGLYCERIN (NITROSTAT) 0.4 MG SL tablet Place 1 tablet (0.4 mg total) under the tongue every 5 (five) minutes as needed for chest pain.  Sherri King omeprazole (PRILOSEC) 20 MG capsule Take 1 capsule (20 mg total) by mouth daily for 14 days.  Sherri King  sertraline (ZOLOFT) 50 MG tablet Take 50 mg by mouth daily as needed (anxiety).   . traMADol (ULTRAM) 50 MG tablet Take 50-100 mg by mouth every 12 (twelve) hours as needed for moderate pain.   . TRELEGY ELLIPTA 100-62.5-25 MCG/INH AEPB INHALE 1 PUFF DAILY     Allergies:   Bee venom   Social History   Tobacco Use  . Smoking status: Former Smoker    Packs/day: 1.00    Years: 60.00    Pack years: 60.00    Types: Cigarettes    Quit date: 04/28/2014    Years since quitting: 4.8  . Smokeless tobacco: Never Used  Substance Use Topics  . Alcohol use: Yes    Alcohol/week: 0.0 standard drinks    Comment: rarely a beer  . Drug use: No     Family Hx: The patient's family history includes Allergies in her sister; Cancer in her father, sister, sister, and another family member; Heart disease in her sister; Heart failure in her mother; Stroke in her mother.  ROS:   Please see the history of present illness.     All other systems reviewed and are negative.   Prior CV studies:   The following studies were reviewed today:  N/A  Labs/Other Tests and Data Reviewed:    EKG:  An ECG dated 04/04/2018 was personally reviewed today and demonstrated:  Normal sinus rhythm without significant ST-T wave changes  Recent Labs: 04/04/2018: ALT 76; BUN 13; Creatinine, Ser 0.64; Hemoglobin 14.0; Platelets 180; Potassium 3.4; Sodium 139   Recent Lipid Panel No results found for: CHOL, TRIG, HDL, CHOLHDL, LDLCALC, LDLDIRECT  Wt Readings from Last 3 Encounters:  02/13/19 170 lb (77.1 kg)  09/26/18 167 lb 3.2 oz (75.8 kg)  08/14/18 166 lb 9.6 oz (75.6 kg)     Objective:    Vital Signs:  BP 114/64   Pulse 64   Ht 5\' 4"  (1.626 m)   Wt 170 lb (77.1 kg)   BMI 29.18 kg/m    VITAL SIGNS:  reviewed  ASSESSMENT & PLAN:    1. Coronary vasospasm: Identified on previous cardiac catheterization in 2009.  No recent recurrence  2. Hypertension: Despite prior history of hypertension, her blood  pressure is actually normal despite on no blood pressure medication.  I would encourage continue diet and exercise  3. Hyperlipidemia: controlled via diet and exercise  4. COPD: No recent exacerbation.  COVID-19 Education: The signs and symptoms of COVID-19 were discussed with the patient and how to seek care for testing (follow up with PCP or arrange E-visit).  The importance of social distancing was discussed today.  Time:   Today, I have spent 10 minutes with the patient with telehealth technology discussing the above problems.     Medication Adjustments/Labs and Tests Ordered: Current medicines are reviewed at length with the patient today.  Concerns regarding medicines are outlined above.   Tests Ordered: No orders of the defined types were placed in this encounter.  Medication Changes: No orders of the defined types were placed in this encounter.   Follow Up:  Either In Person or Virtual in 1 year(s)  Signed, Almyra Deforest, [place]  02/13/2019 11:23 AM    [place]

## 2019-02-13 NOTE — Telephone Encounter (Signed)

## 2019-02-13 NOTE — Patient Instructions (Signed)
Medication Instructions:  Your physician recommends that you continue on your current medications as directed. Please refer to the Current Medication list given to you today.  *If you need a refill on your cardiac medications before your next appointment, please call your pharmacy*  Lab Work: NONE ordered at this time of appointment   If you have labs (blood work) drawn today and your tests are completely normal, you will receive your results only by: Marland Kitchen MyChart Message (if you have MyChart) OR . A paper copy in the mail If you have any lab test that is abnormal or we need to change your treatment, we will call you to review the results.  Testing/Procedures: NONE ordered at this time of appointment   Follow-Up: At [facility], you and your health needs are our priority.  As part of our continuing mission to provide you with exceptional heart care, we have created designated Provider Care Teams.  These Care Teams include your primary Cardiologist (physician) and Advanced Practice Providers (APPs -  Physician Assistants and Nurse Practitioners) who all work together to provide you with the care you need, when you need it.  Your next appointment:   12 month(s)  The format for your next appointment:   In Person  Provider:   Peter Martinique, MD  Other Instructions

## 2019-02-27 DIAGNOSIS — R7303 Prediabetes: Secondary | ICD-10-CM | POA: Diagnosis not present

## 2019-03-01 DIAGNOSIS — R7303 Prediabetes: Secondary | ICD-10-CM | POA: Diagnosis not present

## 2019-03-01 DIAGNOSIS — M810 Age-related osteoporosis without current pathological fracture: Secondary | ICD-10-CM | POA: Diagnosis not present

## 2019-03-01 DIAGNOSIS — Z87891 Personal history of nicotine dependence: Secondary | ICD-10-CM | POA: Diagnosis not present

## 2019-03-13 DIAGNOSIS — I201 Angina pectoris with documented spasm: Secondary | ICD-10-CM | POA: Diagnosis not present

## 2019-03-13 DIAGNOSIS — R0789 Other chest pain: Secondary | ICD-10-CM | POA: Diagnosis not present

## 2019-04-04 DIAGNOSIS — J449 Chronic obstructive pulmonary disease, unspecified: Secondary | ICD-10-CM | POA: Diagnosis not present

## 2019-04-04 DIAGNOSIS — M810 Age-related osteoporosis without current pathological fracture: Secondary | ICD-10-CM | POA: Diagnosis not present

## 2019-04-17 DIAGNOSIS — R0789 Other chest pain: Secondary | ICD-10-CM | POA: Diagnosis not present

## 2019-04-25 DIAGNOSIS — S30860A Insect bite (nonvenomous) of lower back and pelvis, initial encounter: Secondary | ICD-10-CM | POA: Diagnosis not present

## 2019-04-25 DIAGNOSIS — W57XXXA Bitten or stung by nonvenomous insect and other nonvenomous arthropods, initial encounter: Secondary | ICD-10-CM | POA: Diagnosis not present

## 2019-05-08 DIAGNOSIS — K219 Gastro-esophageal reflux disease without esophagitis: Secondary | ICD-10-CM | POA: Diagnosis not present

## 2019-05-08 DIAGNOSIS — E785 Hyperlipidemia, unspecified: Secondary | ICD-10-CM | POA: Diagnosis not present

## 2019-05-08 DIAGNOSIS — N390 Urinary tract infection, site not specified: Secondary | ICD-10-CM | POA: Diagnosis not present

## 2019-05-08 DIAGNOSIS — M810 Age-related osteoporosis without current pathological fracture: Secondary | ICD-10-CM | POA: Diagnosis not present

## 2019-05-15 DIAGNOSIS — N3000 Acute cystitis without hematuria: Secondary | ICD-10-CM | POA: Diagnosis not present

## 2019-05-15 DIAGNOSIS — K219 Gastro-esophageal reflux disease without esophagitis: Secondary | ICD-10-CM | POA: Diagnosis not present

## 2019-05-15 DIAGNOSIS — E785 Hyperlipidemia, unspecified: Secondary | ICD-10-CM | POA: Diagnosis not present

## 2019-05-15 DIAGNOSIS — J439 Emphysema, unspecified: Secondary | ICD-10-CM | POA: Diagnosis not present

## 2019-05-15 DIAGNOSIS — Z0001 Encounter for general adult medical examination with abnormal findings: Secondary | ICD-10-CM | POA: Diagnosis not present

## 2019-05-15 DIAGNOSIS — I201 Angina pectoris with documented spasm: Secondary | ICD-10-CM | POA: Diagnosis not present

## 2019-06-06 ENCOUNTER — Other Ambulatory Visit: Payer: Self-pay | Admitting: Internal Medicine

## 2019-06-08 ENCOUNTER — Other Ambulatory Visit: Payer: Self-pay | Admitting: Internal Medicine

## 2019-06-08 DIAGNOSIS — N644 Mastodynia: Secondary | ICD-10-CM

## 2019-06-19 DIAGNOSIS — R252 Cramp and spasm: Secondary | ICD-10-CM | POA: Diagnosis not present

## 2019-06-19 DIAGNOSIS — J439 Emphysema, unspecified: Secondary | ICD-10-CM | POA: Diagnosis not present

## 2019-06-19 DIAGNOSIS — M810 Age-related osteoporosis without current pathological fracture: Secondary | ICD-10-CM | POA: Diagnosis not present

## 2019-06-19 DIAGNOSIS — E785 Hyperlipidemia, unspecified: Secondary | ICD-10-CM | POA: Diagnosis not present

## 2019-06-19 DIAGNOSIS — I2510 Atherosclerotic heart disease of native coronary artery without angina pectoris: Secondary | ICD-10-CM | POA: Diagnosis not present

## 2019-06-20 ENCOUNTER — Ambulatory Visit: Payer: Medicare Other

## 2019-06-20 ENCOUNTER — Ambulatory Visit
Admission: RE | Admit: 2019-06-20 | Discharge: 2019-06-20 | Disposition: A | Discharge status: Home / Self Care | Payer: Medicare Other | Source: Ambulatory Visit | Attending: Internal Medicine | Admitting: Internal Medicine

## 2019-06-20 ENCOUNTER — Other Ambulatory Visit: Payer: Self-pay

## 2019-06-20 DIAGNOSIS — N644 Mastodynia: Secondary | ICD-10-CM

## 2019-06-20 DIAGNOSIS — R922 Inconclusive mammogram: Secondary | ICD-10-CM | POA: Diagnosis not present

## 2019-07-01 ENCOUNTER — Encounter (HOSPITAL_COMMUNITY): Payer: Self-pay | Admitting: Emergency Medicine

## 2019-07-01 ENCOUNTER — Observation Stay (HOSPITAL_COMMUNITY)
Admission: EM | Admit: 2019-07-01 | Discharge: 2019-07-03 | Disposition: A | Discharge status: Home / Self Care | Payer: Medicare Other | Attending: Family Medicine | Admitting: Family Medicine

## 2019-07-01 ENCOUNTER — Emergency Department (HOSPITAL_COMMUNITY): Payer: Medicare Other

## 2019-07-01 ENCOUNTER — Other Ambulatory Visit: Payer: Self-pay

## 2019-07-01 DIAGNOSIS — E876 Hypokalemia: Secondary | ICD-10-CM | POA: Insufficient documentation

## 2019-07-01 DIAGNOSIS — R0902 Hypoxemia: Secondary | ICD-10-CM | POA: Diagnosis not present

## 2019-07-01 DIAGNOSIS — K449 Diaphragmatic hernia without obstruction or gangrene: Secondary | ICD-10-CM | POA: Diagnosis not present

## 2019-07-01 DIAGNOSIS — K5730 Diverticulosis of large intestine without perforation or abscess without bleeding: Secondary | ICD-10-CM | POA: Insufficient documentation

## 2019-07-01 DIAGNOSIS — F419 Anxiety disorder, unspecified: Secondary | ICD-10-CM | POA: Diagnosis not present

## 2019-07-01 DIAGNOSIS — E785 Hyperlipidemia, unspecified: Secondary | ICD-10-CM | POA: Diagnosis not present

## 2019-07-01 DIAGNOSIS — K219 Gastro-esophageal reflux disease without esophagitis: Secondary | ICD-10-CM | POA: Insufficient documentation

## 2019-07-01 DIAGNOSIS — B9620 Unspecified Escherichia coli [E. coli] as the cause of diseases classified elsewhere: Secondary | ICD-10-CM | POA: Diagnosis not present

## 2019-07-01 DIAGNOSIS — Z8249 Family history of ischemic heart disease and other diseases of the circulatory system: Secondary | ICD-10-CM | POA: Insufficient documentation

## 2019-07-01 DIAGNOSIS — I2510 Atherosclerotic heart disease of native coronary artery without angina pectoris: Secondary | ICD-10-CM | POA: Insufficient documentation

## 2019-07-01 DIAGNOSIS — K7689 Other specified diseases of liver: Secondary | ICD-10-CM | POA: Insufficient documentation

## 2019-07-01 DIAGNOSIS — I10 Essential (primary) hypertension: Secondary | ICD-10-CM | POA: Diagnosis not present

## 2019-07-01 DIAGNOSIS — G8929 Other chronic pain: Principal | ICD-10-CM | POA: Insufficient documentation

## 2019-07-01 DIAGNOSIS — M549 Dorsalgia, unspecified: Secondary | ICD-10-CM | POA: Diagnosis not present

## 2019-07-01 DIAGNOSIS — M546 Pain in thoracic spine: Secondary | ICD-10-CM | POA: Insufficient documentation

## 2019-07-01 DIAGNOSIS — M48061 Spinal stenosis, lumbar region without neurogenic claudication: Secondary | ICD-10-CM | POA: Diagnosis not present

## 2019-07-01 DIAGNOSIS — Z8541 Personal history of malignant neoplasm of cervix uteri: Secondary | ICD-10-CM | POA: Insufficient documentation

## 2019-07-01 DIAGNOSIS — J449 Chronic obstructive pulmonary disease, unspecified: Secondary | ICD-10-CM | POA: Diagnosis not present

## 2019-07-01 DIAGNOSIS — R3911 Hesitancy of micturition: Secondary | ICD-10-CM | POA: Diagnosis not present

## 2019-07-01 DIAGNOSIS — N281 Cyst of kidney, acquired: Secondary | ICD-10-CM | POA: Insufficient documentation

## 2019-07-01 DIAGNOSIS — M5489 Other dorsalgia: Secondary | ICD-10-CM | POA: Diagnosis not present

## 2019-07-01 DIAGNOSIS — M4807 Spinal stenosis, lumbosacral region: Secondary | ICD-10-CM | POA: Diagnosis not present

## 2019-07-01 DIAGNOSIS — Z79899 Other long term (current) drug therapy: Secondary | ICD-10-CM | POA: Insufficient documentation

## 2019-07-01 DIAGNOSIS — Z20822 Contact with and (suspected) exposure to covid-19: Secondary | ICD-10-CM | POA: Insufficient documentation

## 2019-07-01 DIAGNOSIS — M5127 Other intervertebral disc displacement, lumbosacral region: Secondary | ICD-10-CM | POA: Diagnosis not present

## 2019-07-01 DIAGNOSIS — N200 Calculus of kidney: Secondary | ICD-10-CM | POA: Insufficient documentation

## 2019-07-01 DIAGNOSIS — N644 Mastodynia: Secondary | ICD-10-CM | POA: Insufficient documentation

## 2019-07-01 DIAGNOSIS — M47816 Spondylosis without myelopathy or radiculopathy, lumbar region: Secondary | ICD-10-CM | POA: Insufficient documentation

## 2019-07-01 DIAGNOSIS — Z7982 Long term (current) use of aspirin: Secondary | ICD-10-CM | POA: Insufficient documentation

## 2019-07-01 DIAGNOSIS — Z87891 Personal history of nicotine dependence: Secondary | ICD-10-CM | POA: Diagnosis not present

## 2019-07-01 DIAGNOSIS — R0602 Shortness of breath: Secondary | ICD-10-CM | POA: Diagnosis not present

## 2019-07-01 DIAGNOSIS — I700 Atherosclerosis of aorta: Secondary | ICD-10-CM | POA: Diagnosis not present

## 2019-07-01 DIAGNOSIS — Z743 Need for continuous supervision: Secondary | ICD-10-CM | POA: Diagnosis not present

## 2019-07-01 DIAGNOSIS — Z823 Family history of stroke: Secondary | ICD-10-CM | POA: Insufficient documentation

## 2019-07-01 DIAGNOSIS — N390 Urinary tract infection, site not specified: Secondary | ICD-10-CM | POA: Diagnosis not present

## 2019-07-01 DIAGNOSIS — M545 Low back pain, unspecified: Secondary | ICD-10-CM | POA: Diagnosis present

## 2019-07-01 DIAGNOSIS — Z808 Family history of malignant neoplasm of other organs or systems: Secondary | ICD-10-CM | POA: Insufficient documentation

## 2019-07-01 DIAGNOSIS — M5126 Other intervertebral disc displacement, lumbar region: Secondary | ICD-10-CM | POA: Diagnosis not present

## 2019-07-01 DIAGNOSIS — F329 Major depressive disorder, single episode, unspecified: Secondary | ICD-10-CM | POA: Insufficient documentation

## 2019-07-01 DIAGNOSIS — Z91030 Bee allergy status: Secondary | ICD-10-CM | POA: Insufficient documentation

## 2019-07-01 DIAGNOSIS — Z806 Family history of leukemia: Secondary | ICD-10-CM | POA: Insufficient documentation

## 2019-07-01 LAB — URINALYSIS, ROUTINE W REFLEX MICROSCOPIC
Bacteria, UA: NONE SEEN
Bilirubin Urine: NEGATIVE
Glucose, UA: NEGATIVE mg/dL
Ketones, ur: 20 mg/dL — AB
Nitrite: POSITIVE — AB
Protein, ur: NEGATIVE mg/dL
Specific Gravity, Urine: 1.023 (ref 1.005–1.030)
pH: 5 (ref 5.0–8.0)

## 2019-07-01 LAB — COMPREHENSIVE METABOLIC PANEL
ALT: 22 U/L (ref 0–44)
AST: 26 U/L (ref 15–41)
Albumin: 4.1 g/dL (ref 3.5–5.0)
Alkaline Phosphatase: 74 U/L (ref 38–126)
Anion gap: 11 (ref 5–15)
BUN: 20 mg/dL (ref 8–23)
CO2: 25 mmol/L (ref 22–32)
Calcium: 8.7 mg/dL — ABNORMAL LOW (ref 8.9–10.3)
Chloride: 107 mmol/L (ref 98–111)
Creatinine, Ser: 0.65 mg/dL (ref 0.44–1.00)
GFR calc Af Amer: >60 mL/min (ref >60)
GFR calc non Af Amer: >60 mL/min (ref >60)
Glucose, Bld: 136 mg/dL — ABNORMAL HIGH (ref 70–99)
Potassium: 3.3 mmol/L — ABNORMAL LOW (ref 3.5–5.1)
Sodium: 143 mmol/L (ref 135–145)
Total Bilirubin: 0.7 mg/dL (ref 0.3–1.2)
Total Protein: 7 g/dL (ref 6.5–8.1)

## 2019-07-01 LAB — CBC
HCT: 43.9 % (ref 36.0–46.0)
Hemoglobin: 14.2 g/dL (ref 12.0–15.0)
MCH: 31 pg (ref 26.0–34.0)
MCHC: 32.3 g/dL (ref 30.0–36.0)
MCV: 95.9 fL (ref 80.0–100.0)
Platelets: 152 10*3/uL (ref 150–400)
RBC: 4.58 MIL/uL (ref 3.87–5.11)
RDW: 14.1 % (ref 11.5–15.5)
WBC: 14 10*3/uL — ABNORMAL HIGH (ref 4.0–10.5)
nRBC: 0 % (ref 0.0–0.2)

## 2019-07-01 MED ORDER — SODIUM CHLORIDE (PF) 0.9 % IJ SOLN
INTRAMUSCULAR | Status: AC
  Filled 2019-07-01: qty 50

## 2019-07-01 MED ORDER — FENTANYL CITRATE (PF) 100 MCG/2ML IJ SOLN
50.0000 ug | Freq: Once | INTRAMUSCULAR | Status: AC
  Administered 2019-07-01: 50 ug via INTRAVENOUS
  Filled 2019-07-01: qty 2

## 2019-07-01 MED ORDER — IOHEXOL 300 MG/ML  SOLN
100.0000 mL | Freq: Once | INTRAMUSCULAR | Status: AC | PRN
  Administered 2019-07-01: 100 mL via INTRAVENOUS

## 2019-07-01 MED ORDER — FENTANYL CITRATE (PF) 100 MCG/2ML IJ SOLN: 25.0000 ug | Freq: Once | INTRAMUSCULAR | Status: DC

## 2019-07-01 MED ORDER — OXYCODONE-ACETAMINOPHEN 5-325 MG PO TABS
1.0000 | ORAL_TABLET | Freq: Once | ORAL | Status: AC
  Administered 2019-07-02: 1 via ORAL
  Filled 2019-07-01: qty 1

## 2019-07-01 MED ORDER — MORPHINE SULFATE (PF) 4 MG/ML IV SOLN
4.0000 mg | Freq: Once | INTRAVENOUS | Status: AC
  Administered 2019-07-01: 4 mg via INTRAVENOUS
  Filled 2019-07-01: qty 1

## 2019-07-01 MED ORDER — HYDROMORPHONE HCL 1 MG/ML IJ SOLN
0.5000 mg | Freq: Once | INTRAMUSCULAR | Status: AC
  Administered 2019-07-01: 0.5 mg via INTRAVENOUS
  Filled 2019-07-01: qty 1

## 2019-07-01 MED ORDER — ALBUTEROL SULFATE HFA 108 (90 BASE) MCG/ACT IN AERS
4.0000 | INHALATION_SPRAY | Freq: Once | RESPIRATORY_TRACT | Status: AC
  Administered 2019-07-01: 4 via RESPIRATORY_TRACT
  Filled 2019-07-01: qty 6.7

## 2019-07-01 MED ORDER — ALBUTEROL SULFATE HFA 108 (90 BASE) MCG/ACT IN AERS: 8.0000 | INHALATION_SPRAY | Freq: Once | RESPIRATORY_TRACT | Status: DC

## 2019-07-01 NOTE — ED Notes (Signed)
Attempt IV placement x2 without success

## 2019-07-01 NOTE — ED Provider Notes (Signed)
[facility] DEPT Provider Note   CSN: [id number] Arrival date & time: 07/01/19  1810     History Chief Complaint  Patient presents with  . Back Pain    Sherri King is a 80 y.o. female.  HPI 79 year old female with a history of cervical cancer (which has been in remission for a long time per the patient) , COPD, GERD, hypertension, anxiety presents to the ER with sudden onset of lower back pain.  Patient states that she has had low back pain for 15 years, however today she had a sudden sharp onset of stabbing pain after sitting down in a chair.  Patient states that she was up and about doing her normal routine of going to church, and putting a pot roast in the slow cooker, when she sat down and felt a sudden sharp jolt.  She is not sure if this feels like her typical low back pain that has been exacerbated, or if this is a different kind of pain.  She denies any groin numbness, radiation into her legs, no bowel incontinence..  States that she has frequent urinary incontinence, but this is chronic.  She denies any night sweats, fevers, chills or unintended weight loss.  She states that she has been able to walk to the bathroom, but this has been very painful.  She denies any chest pain, states that she feels a little short of breath but with her history of COPD this is not unusual.  She has not taken anything for pain.    Past Medical History:  Diagnosis Date  . Anxiety   . Arthritis   . Cancer ([place])    cervical  . COPD (chronic obstructive pulmonary disease) ([place])   . Coronary vasospasm ([place])   . Depression   . GERD (gastroesophageal reflux disease)   . Hyperlipidemia   . Hypertension   . Multiple thyroid nodules     Patient Active Problem List   Diagnosis Date Noted  . COPD GOLD II with reversibility  10/26/2016  . 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 HLD (hyperlipidemia) 05/04/2013  . Atypical chest pain 06/07/2011  . Coronary vasospasm  ([place]) 06/07/2011  . Hypertension 06/07/2011  . Tobacco abuse 06/07/2011    Past Surgical History:  Procedure Laterality Date  . ABDOMINAL HYSTERECTOMY     due to cancer  . BREAST BIOPSY Left 08/21/2014  . BREAST EXCISIONAL BIOPSY Left 10/2014  . BREAST SURGERY Bilateral 2016   removal of several nodules from right breast  . RADIOACTIVE SEED GUIDED EXCISIONAL BREAST BIOPSY Left 10/31/2014   Procedure: RADIOACTIVE SEED GUIDED EXCISIONAL LEFT BREAST BIOPSY;  Surgeon: Rolm Bookbinder, MD;  Location: [facility];  Service: General;  Laterality: Left;  . THYROIDECTOMY, PARTIAL    . TUBAL LIGATION    . VESICOVAGINAL FISTULA CLOSURE W/ TAH       OB History   No obstetric history on file.     Family History  Problem Relation Age of Onset  . Heart failure Mother   . Stroke Mother   . Cancer Father        lung  . Cancer Sister        lung, mets to brain  . Breast cancer Sister   . Cancer Sister        lung  . Heart disease Sister   . Allergies Sister        all 8 sisters  . Cancer Other  bone    Social History   Tobacco Use  . Smoking status: Former Smoker    Packs/day: 1.00    Years: 60.00    Pack years: 60.00    Types: Cigarettes    Quit date: 04/28/2014    Years since quitting: 5.1  . Smokeless tobacco: Never Used  Vaping Use  . Vaping Use: Never used  Substance Use Topics  . Alcohol use: Yes    Alcohol/week: 0.0 standard drinks    Comment: rarely a beer  . Drug use: No    Home Medications Prior to Admission medications   Medication Sig Start Date End Date Taking? Authorizing Provider  acetaminophen (TYLENOL) 500 MG tablet Take 500 mg by mouth every 6 (six) hours as needed for mild pain.     [provider]  aspirin EC 81 MG tablet Take 81 mg by mouth daily.    [provider]  carboxymethylcellulose (REFRESH PLUS) 0.5 % SOLN Place 1 drop into both eyes 2 (two) times daily as needed (dryness).     [provider]  cholecalciferol (VITAMIN D) 1000 units tablet Take 1,000 Units by mouth daily.    [provider]  FORTEO 600 MCG/2.4ML SOLN Inject 20 mcg into the skin daily.  08/16/17   [provider]  Multiple Vitamins-Minerals (MULTIVITAMIN WITH MINERALS) tablet Take 1 tablet by mouth daily.    [provider]  nitroGLYCERIN (NITROSTAT) 0.4 MG SL tablet Place 1 tablet (0.4 mg total) under the tongue every 5 (five) minutes as needed for chest pain. 01/05/18   Martin[place], Peter M, MD  omeprazole (PRILOSEC) 20 MG capsule Take 1 capsule (20 mg total) by mouth daily for 14 days. 04/04/18 02/13/19  McDonald, Mia A, PA-C  sertraline (ZOLOFT) 50 MG tablet Take 50 mg by mouth daily as needed (anxiety).  07/29/17   [provider]  traMADol (ULTRAM) 50 MG tablet Take 50-100 mg by mouth every 12 (twelve) hours as needed for moderate pain.  08/30/17   [provider]  Donnal Debar 100-62.5-25 MCG/INH AEPB INHALE 1 PUFF DAILY 07/19/18   [provider]    Allergies    Bee venom  Review of Systems   Review of Systems  Constitutional: Negative for chills and fever.  HENT: Negative for ear pain and sore throat.   Eyes: Negative for pain and visual disturbance.  Respiratory: Negative for cough and shortness of breath.   Cardiovascular: Negative for chest pain and palpitations.  Gastrointestinal: Negative for abdominal pain and vomiting.  Genitourinary: Negative for difficulty urinating, dysuria and hematuria.  Musculoskeletal: Positive for back pain. Negative for arthralgias.  Skin: Negative for color change and rash.  Neurological: Negative for seizures, syncope, weakness and numbness.  All other systems reviewed and are negative.   Physical Exam Updated Vital Signs BP 132/72   Pulse (!) 102   Temp 99.4 F (37.4 C) (Oral)   Resp (!) 28   SpO2 97%   Physical Exam Vitals and nursing note reviewed.  Constitutional:      General: She is not in acute  distress.    Appearance: Normal appearance. She is well-developed. She is not ill-appearing, toxic-appearing or diaphoretic.  HENT:     Head: Normocephalic and atraumatic.  Eyes:     Conjunctiva/sclera: Conjunctivae normal.     Pupils: Pupils are equal, round, and reactive to light.  Cardiovascular:     Rate and Rhythm: Regular rhythm. Tachycardia present.     Pulses: Normal pulses.  Heart sounds: Normal heart sounds. No murmur heard.   Pulmonary:     Effort: Pulmonary effort is normal. No respiratory distress.     Breath sounds: Wheezing present.     Comments: Slightly tachypneic, with scant wheezes and slightly decreased breath sounds Abdominal:     General: Abdomen is flat.     Palpations: Abdomen is soft.     Tenderness: There is no abdominal tenderness. There is left CVA tenderness. There is no right CVA tenderness.  Musculoskeletal:        General: Tenderness present. No swelling, deformity or signs of injury.     Cervical back: Normal range of motion and neck supple. No tenderness.     Right lower leg: No edema.     Left lower leg: No edema.     Comments: Midline paraspinal L-spine tenderness. Paraspinal muscle tenderness to the lumbar muscles on the left. L flank TTP. No cervical/thoracic midline tenderness.   No noticeable step-offs, crepitus, fluctuance or rashes.  Sensations intact in upper and lower extremities bilaterally.  5/5 grip strength in upper extremities, 4/5 strength in lower extremities bilaterally.  Strength and range of motion is slightly limited on exam secondary to pain.  I was able to witness the patient ambulate to the bathroom with a limp. Full strength w/ dorsiflexion, plantar flexion  Lymphadenopathy:     Cervical: No cervical adenopathy.  Skin:    General: Skin is warm and dry.     Capillary Refill: Capillary refill takes less than 2 seconds.     Findings: No bruising, erythema or rash.  Neurological:     General: No focal deficit present.      Mental Status: She is alert and oriented to person, place, and time.     Sensory: No sensory deficit.     Motor: No weakness.  Psychiatric:        Mood and Affect: Mood normal.        Behavior: Behavior normal.     ED Results / Procedures / Treatments   Labs (all labs ordered are listed, but only abnormal results are displayed) Labs Reviewed  CBC - Abnormal; Notable for the following components:      Result Value   WBC 14.0 (*)    All other components within normal limits  COMPREHENSIVE METABOLIC PANEL - Abnormal; Notable for the following components:   Potassium 3.3 (*)    Glucose, Bld 136 (*)    Calcium 8.7 (*)    All other components within normal limits  URINALYSIS, ROUTINE W REFLEX MICROSCOPIC    EKG None  Radiology DG Chest 1 View  Result Date: 07/01/2019 CLINICAL DATA:  Lower back pain for 15 years, increasing today EXAM: CHEST  1 VIEW COMPARISON:  04/04/2018 FINDINGS: Single frontal view of the chest demonstrates an unremarkable cardiac silhouette. No airspace disease, effusion, or pneumothorax. No acute displaced fractures. IMPRESSION: 1. No acute intrathoracic process. Electronically Signed   By: Randa Ngo M.D.   On: 07/01/2019 19:22   DG Lumbar Spine Complete  Result Date: 07/01/2019 CLINICAL DATA:  Chronic low back pain for 15 years, worsening today EXAM: LUMBAR SPINE - COMPLETE 4+ VIEW COMPARISON:  04/04/2018 FINDINGS: Frontal, bilateral oblique, lateral views of the lumbar spine are obtained. There is transitional anatomy, with sacralization of the L5 vertebral body and hypoplastic twelfth ribs. There is a chronic compression deformity involving the T12 vertebral body with 50% loss of height, stable. No acute bony abnormalities. Significant spondylosis and facet hypertrophy  within the lower lumbar spine greatest at L4-5 and L5-S1. Sacroiliac joints are normal. 8 mm calculus lower pole left kidney again noted not appreciably changed. Vascular calcifications  throughout the aorta. IMPRESSION: 1. Stable 8 mm left renal calculus. 2. Chronic T12 compression deformity. 3. No acute bony abnormality. 4. Stable prominent spondylosis and facet hypertrophy at the lumbosacral junction. Electronically Signed   By: Randa Ngo M.D.   On: 07/01/2019 19:25    Procedures Procedures (including critical care time)  Medications Ordered in ED Medications  fentaNYL (SUBLIMAZE) injection 50 mcg (50 mcg Intravenous Given 07/01/19 2002)  albuterol (VENTOLIN HFA) 108 (90 Base) MCG/ACT inhaler 4 puff (4 puffs Inhalation Given 07/01/19 2002)    ED Course  I have reviewed the triage vital signs and the nursing notes.  Pertinent labs & imaging results that were available during my care of the patient were reviewed by me and considered in my medical decision making (see chart for details).    MDM Rules/Calculators/A&P                         79 year old female with 15 years of chronic back pain, with worsening pain since this afternoon On presentation, the patient is a slightly anxious appearing, slightly tachypneic with audible wheezes, however no acute respiratory distress, speaking in full sentences.  She is nontoxic-appearing.  On arrival, the patient was slightly tachypneic and tachycardic, however blood pressure was reassuring.  Temp of 99.4.  Physical exam with left paraspinal/CVA tenderness, midline L-spine tenderness.  She denies any groin numbness, acute bowel and bladder incontinence, numbness and tingling down the legs.  No fevers or chills, no night sweats or unintended weight loss.  Patient was hesitant to move her lower extremities, however she was still able to raise her legs straight into the air.  Slightly decreased strength secondary to pain.  Gross sensations intact.  5/5 strength with dorsi flexion, and plantarflexion.  I was able to watch the patient ambulate to the bathroom, she was able to gingerly and with a limp.  Lung sounds with scant wheezes and  slightly decreased, with slight tachypnea.  Given her presentation, ordered basic labs, chest x-ray, EKG, and plain films of the lumbar spine.  Pain treated with fentanyl, patient given albuterol for wheezing.  Discussed with Dr. Roslynn Amble who also saw and evaluated the patient, given her age, cancer history, and sudden onset of low back pain with no significant trigger, will order CT of the abdomen pelvis with views of the L-spine to rule out dissection.Given the patient's history of cervical and breast cancer, there is also a concern for rule out metastasis to the spine.  CBC with leukocytosis of 14.  I personally reviewed her previous labs, which shows these level to be at baseline.  Normal hemoglobin.  CMP with mild hypokalemia of 3.3, normal renal function and liver function.  EKG with sinus tach.  UA still pending.  Plain films of L-spine noted a stable left renal calculus, but no acute fractures.  Chest x-ray with out acute abnormalities.  Signed the patient out to Dr. Roslynn Amble who will follow up on the results of her CT abdomen pelvis with contrast and L-spine.  If her imaging is normal, with proper pain control, and normal ambulation of the patient, I suspect that she will be stable for discharge.  Final Clinical Impression(s) / ED Diagnoses Final diagnoses:  Low back pain    Rx / DC Orders ED Discharge  Orders    None       Lyndel Safe 07/01/19 2051    Lucrezia Starch, MD 07/01/19 [phone]

## 2019-07-01 NOTE — ED Triage Notes (Signed)
Patient here from home via EMS reporting lower back pain x15 years, increased today. Denies taking anything for the pain today. Normally ambulatory.

## 2019-07-02 ENCOUNTER — Observation Stay (HOSPITAL_COMMUNITY): Payer: Medicare Other

## 2019-07-02 ENCOUNTER — Encounter (HOSPITAL_COMMUNITY): Payer: Self-pay | Admitting: Internal Medicine

## 2019-07-02 DIAGNOSIS — E785 Hyperlipidemia, unspecified: Secondary | ICD-10-CM

## 2019-07-02 DIAGNOSIS — I10 Essential (primary) hypertension: Secondary | ICD-10-CM | POA: Diagnosis not present

## 2019-07-02 DIAGNOSIS — J449 Chronic obstructive pulmonary disease, unspecified: Secondary | ICD-10-CM

## 2019-07-02 DIAGNOSIS — M545 Low back pain, unspecified: Secondary | ICD-10-CM | POA: Diagnosis present

## 2019-07-02 DIAGNOSIS — M546 Pain in thoracic spine: Secondary | ICD-10-CM | POA: Diagnosis not present

## 2019-07-02 LAB — SARS CORONAVIRUS 2 BY RT PCR (HOSPITAL ORDER, PERFORMED IN ~~LOC~~ HOSPITAL LAB): SARS Coronavirus 2: NEGATIVE

## 2019-07-02 MED ORDER — HYDROCODONE-ACETAMINOPHEN 5-325 MG PO TABS: 1.0000 | ORAL_TABLET | Freq: Four times a day (QID) | ORAL | Status: DC | PRN

## 2019-07-02 MED ORDER — SODIUM CHLORIDE 0.9 % IV SOLN
1.0000 g | INTRAVENOUS | Status: DC
  Administered 2019-07-02: 1 g via INTRAVENOUS
  Filled 2019-07-02: qty 1
  Filled 2019-07-02: qty 10

## 2019-07-02 MED ORDER — SODIUM CHLORIDE 0.9% FLUSH: 3.0000 mL | INTRAVENOUS | Status: DC | PRN

## 2019-07-02 MED ORDER — KETOROLAC TROMETHAMINE 15 MG/ML IJ SOLN: 15.0000 mg | Freq: Four times a day (QID) | INTRAMUSCULAR | Status: DC | PRN

## 2019-07-02 MED ORDER — POTASSIUM CHLORIDE 20 MEQ/15ML (10%) PO SOLN
40.0000 meq | Freq: Once | ORAL | Status: AC
  Administered 2019-07-02: 40 meq via ORAL
  Filled 2019-07-02: qty 30

## 2019-07-02 MED ORDER — FORTEO 600 MCG/2.4ML ~~LOC~~ SOLN: 20.0000 ug | Freq: Every day | SUBCUTANEOUS | Status: DC

## 2019-07-02 MED ORDER — FLUTICASONE FUROATE-VILANTEROL 200-25 MCG/INH IN AEPB
1.0000 | INHALATION_SPRAY | Freq: Every day | RESPIRATORY_TRACT | Status: DC
  Administered 2019-07-02 – 2019-07-03 (×2): 1 via RESPIRATORY_TRACT
  Filled 2019-07-02: qty 28

## 2019-07-02 MED ORDER — POLYVINYL ALCOHOL 1.4 % OP SOLN: 1.0000 [drp] | Freq: Two times a day (BID) | OPHTHALMIC | Status: DC | PRN

## 2019-07-02 MED ORDER — FLUTICASONE-UMECLIDIN-VILANT 100-62.5-25 MCG/INH IN AEPB: 1.0000 | INHALATION_SPRAY | Freq: Every day | RESPIRATORY_TRACT | Status: DC

## 2019-07-02 MED ORDER — NITROGLYCERIN 0.4 MG SL SUBL: 0.4000 mg | SUBLINGUAL_TABLET | SUBLINGUAL | Status: DC | PRN

## 2019-07-02 MED ORDER — PANTOPRAZOLE SODIUM 40 MG PO TBEC
40.0000 mg | DELAYED_RELEASE_TABLET | Freq: Every day | ORAL | Status: DC
  Administered 2019-07-02 – 2019-07-03 (×2): 40 mg via ORAL
  Filled 2019-07-02 (×2): qty 1

## 2019-07-02 MED ORDER — UMECLIDINIUM BROMIDE 62.5 MCG/INH IN AEPB
1.0000 | INHALATION_SPRAY | Freq: Every day | RESPIRATORY_TRACT | Status: DC
  Administered 2019-07-02 – 2019-07-03 (×2): 1 via RESPIRATORY_TRACT
  Filled 2019-07-02: qty 7

## 2019-07-02 MED ORDER — METHOCARBAMOL 1000 MG/10ML IJ SOLN
500.0000 mg | Freq: Four times a day (QID) | INTRAVENOUS | Status: DC | PRN
  Administered 2019-07-02: 500 mg via INTRAVENOUS
  Filled 2019-07-02: qty 500

## 2019-07-02 MED ORDER — SODIUM CHLORIDE 0.9 % IV SOLN: 250.0000 mL | INTRAVENOUS | Status: DC | PRN

## 2019-07-02 MED ORDER — SODIUM CHLORIDE 0.9% FLUSH
3.0000 mL | Freq: Two times a day (BID) | INTRAVENOUS | Status: DC
  Administered 2019-07-02 – 2019-07-03 (×3): 3 mL via INTRAVENOUS

## 2019-07-02 MED ORDER — KETOROLAC TROMETHAMINE 30 MG/ML IJ SOLN
INTRAMUSCULAR | Status: AC
  Administered 2019-07-02: 15 mg
  Filled 2019-07-02: qty 1

## 2019-07-02 MED ORDER — ENOXAPARIN SODIUM 40 MG/0.4ML ~~LOC~~ SOLN
40.0000 mg | SUBCUTANEOUS | Status: DC
  Administered 2019-07-02 – 2019-07-03 (×2): 40 mg via SUBCUTANEOUS
  Filled 2019-07-02 (×2): qty 0.4

## 2019-07-02 MED ORDER — ONDANSETRON HCL 4 MG/2ML IJ SOLN
4.0000 mg | Freq: Three times a day (TID) | INTRAMUSCULAR | Status: DC | PRN
  Administered 2019-07-02: 4 mg via INTRAVENOUS
  Filled 2019-07-02: qty 2

## 2019-07-02 MED ORDER — ASPIRIN EC 81 MG PO TBEC
81.0000 mg | DELAYED_RELEASE_TABLET | Freq: Every day | ORAL | Status: DC
  Administered 2019-07-02 – 2019-07-03 (×2): 81 mg via ORAL
  Filled 2019-07-02 (×2): qty 1

## 2019-07-02 MED ORDER — ACETAMINOPHEN 500 MG PO TABS
500.0000 mg | ORAL_TABLET | Freq: Four times a day (QID) | ORAL | Status: DC | PRN
  Administered 2019-07-02: 500 mg via ORAL
  Filled 2019-07-02: qty 1

## 2019-07-02 MED ORDER — SERTRALINE HCL 50 MG PO TABS: 50.0000 mg | ORAL_TABLET | Freq: Every day | ORAL | Status: DC | PRN

## 2019-07-02 MED ORDER — DOCUSATE SODIUM 100 MG PO CAPS
100.0000 mg | ORAL_CAPSULE | Freq: Two times a day (BID) | ORAL | Status: DC
  Administered 2019-07-02 – 2019-07-03 (×3): 100 mg via ORAL
  Filled 2019-07-02 (×3): qty 1

## 2019-07-02 MED ORDER — KETOROLAC TROMETHAMINE 15 MG/ML IJ SOLN
15.0000 mg | Freq: Four times a day (QID) | INTRAMUSCULAR | Status: DC
  Administered 2019-07-02 (×2): 15 mg via INTRAVENOUS
  Filled 2019-07-02 (×2): qty 1

## 2019-07-02 MED ORDER — PROMETHAZINE HCL 25 MG/ML IJ SOLN: 12.5000 mg | Freq: Four times a day (QID) | INTRAMUSCULAR | Status: DC | PRN

## 2019-07-02 MED ORDER — SERTRALINE HCL 50 MG PO TABS
50.0000 mg | ORAL_TABLET | Freq: Every day | ORAL | Status: DC
  Administered 2019-07-02 – 2019-07-03 (×2): 50 mg via ORAL
  Filled 2019-07-02 (×2): qty 1

## 2019-07-02 NOTE — Progress Notes (Signed)
Patient seen and examined at bedside, patient admitted after midnight, please see earlier detailed admission note by Neena Rhymes, MD. Briefly, patient presented secondary to severe back pain.  Unknown etiology.  Patient also has nausea and vomiting.  Urinalysis is concerning for possible UTI.  Patient with no dysuria but does have some hesitancy extensively.  Will add urine culture and start empiric ceftriaxone.  We will also obtain a CT thoracic spine to rule out compression fracture as she has had these in the past.  On examination, patient has some tenderness over thoracic spine with no associated paraspinal tenderness.  She also has some left flank pain on mild palpation.  Will continue pain medication, antiemetics while inpatient.  Physical therapy evaluated and recommended no PT follow-up.  If pain is controlled, nausea is controlled, no concerning findings on imaging, patient is likely stable for discharge tomorrow.  Anticipate discharge home.   Cordelia Poche, MD [facility] 07/02/2019, 1:42 PM

## 2019-07-02 NOTE — ED Notes (Signed)
Call for report (was notified to call after [id number])

## 2019-07-02 NOTE — H&P (Signed)
History and Physical    Sherri King YTK:[id number] DOB: [birth year] DOA: 07/01/2019  PCP: Deland Pretty, MD (Confirm with patient/family/NH records and if not entered, this has to be entered at [facility] point of entry) Patient coming from: home  I have personally briefly reviewed patient's old medical records in [place]  Chief Complaint: severe back pain  HPI: Sherri King is a 79 y.o. female with medical history significant of cervical cancer , COPD-GOLD II, GERD, hypertension, anxiety presents to the ER with sudden onset of lower back pain.  Patient states that she has had low back pain for 15 years, however today she had a sudden sharp onset of stabbing pain after sitting down in a chair.  Patient states that she was up and about doing her normal routine of going to church, and putting a pot roast in the slow cooker, when she sat down and felt a sudden sharp jolt.  She is not sure if this feels like her typical low back pain that has been exacerbated, or if this is a different kind of pain.  She denies any groin numbness, radiation into her legs, no bowel incontinence..  States that she has frequent urinary incontinence, but this is chronic.  She denies any night sweats, fevers, chills or unintended weight loss.  She states that she has been able to walk to the bathroom, but this has been very painful.  She denies any chest pain, states that she feels a little short of breath but with her history of COPD this is not unusual.  She has not taken anything for pain.  ED Course: Afebrile, VSS. Lab notable for WBC 14,0, K3.3, Glucose 126, U/A trace nitrites micro with 6-10 WBC/hpf. CT chest 2020 with emphysema, CT abd/pelvis stable stone left kidney lowerpole, Lumbar spine - chronic T12 compression  Review of Systems: As per HPI otherwise 10 point review of systems negative.    Past Medical History:  Diagnosis Date  . Anxiety   . Arthritis   . Cancer ([place])    cervical  . COPD  (chronic obstructive pulmonary disease) ([place])   . Coronary vasospasm ([place])   . Depression   . GERD (gastroesophageal reflux disease)   . Hyperlipidemia   . Hypertension   . Multiple thyroid nodules     Past Surgical History:  Procedure Laterality Date  . ABDOMINAL HYSTERECTOMY     due to cancer  . BREAST BIOPSY Left 08/21/2014  . BREAST EXCISIONAL BIOPSY Left 10/2014  . BREAST SURGERY Bilateral 2016   removal of several nodules from right breast  . RADIOACTIVE SEED GUIDED EXCISIONAL BREAST BIOPSY Left 10/31/2014   Procedure: RADIOACTIVE SEED GUIDED EXCISIONAL LEFT BREAST BIOPSY;  Surgeon: Rolm Bookbinder, MD;  Location: [place];  Service: General;  Laterality: Left;  . THYROIDECTOMY, PARTIAL    . TUBAL LIGATION    . VESICOVAGINAL FISTULA CLOSURE W/ TAH     Social Hx -  Married x 2: 1st x 3 years, 2nd - widowed after 48 years. Three sons, multiple grandchildren, 1 great-grand. 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 Lives in her own home and her son lives with her. I-ADLs.   reports that she quit smoking about 5 years ago. Her smoking use included cigarettes. She has a 60.00 pack-year smoking history. She has never used smokeless tobacco. She reports current alcohol use. She reports that she does not use drugs.  Allergies  Allergen Reactions  . Bee Venom Diarrhea, Nausea And Vomiting and Swelling    Family History  Problem Relation Age of Onset  . Heart failure Mother   . Stroke Mother   . Cancer Father        lung  . Cancer Sister        lung, mets to brain  . Breast cancer Sister   . Cancer Sister        lung  . Heart disease Sister   . Allergies Sister        all 8 sisters  . Cancer Other        bone     Prior to Admission medications   Medication Sig Start Date End Date Taking? Authorizing Provider  acetaminophen (TYLENOL) 500 MG tablet Take 500 mg by mouth every 6 (six) hours as  needed for mild pain.     [provider]  aspirin EC 81 MG tablet Take 81 mg by mouth daily.    [provider]  carboxymethylcellulose (REFRESH PLUS) 0.5 % SOLN Place 1 drop into both eyes 2 (two) times daily as needed (dryness).     [provider]  cholecalciferol (VITAMIN D) 1000 units tablet Take 1,000 Units by mouth daily.    [provider]  FORTEO 600 MCG/2.4ML SOLN Inject 20 mcg into the skin daily.  08/16/17   [provider]  Multiple Vitamins-Minerals (MULTIVITAMIN WITH MINERALS) tablet Take 1 tablet by mouth daily.    [provider]  nitroGLYCERIN (NITROSTAT) 0.4 MG SL tablet Place 1 tablet (0.4 mg total) under the tongue every 5 (five) minutes as needed for chest pain. 01/05/18   Martinique, Peter M, MD  omeprazole (PRILOSEC) 20 MG capsule Take 1 capsule (20 mg total) by mouth daily for 14 days. 04/04/18 02/13/19  McDonald, Mia A, PA-C  sertraline (ZOLOFT) 50 MG tablet Take 50 mg by mouth daily as needed (anxiety).  07/29/17   [provider]  traMADol (ULTRAM) 50 MG tablet Take 50-100 mg by mouth every 12 (twelve) hours as needed for moderate pain.  08/30/17   [provider]  Donnal Debar 100-62.5-25 MCG/INH AEPB INHALE 1 PUFF DAILY 07/19/18   [provider]    Physical Exam: Vitals:   07/01/19 1845 07/01/19 1952 07/01/19 2244 07/02/19 0000  BP: 125/67 132/72 134/67 (!) 145/83  Pulse: (!) 102 (!) 102 98 100  Resp: (!) 28 (!) 28 (!) 25 (!) 28  Temp:      TempSrc:      SpO2: 96% 97% 97% 94%    Constitutional: NAD, calm, comfortable Vitals:   07/01/19 1845 07/01/19 1952 07/01/19 2244 07/02/19 0000  BP: 125/67 132/72 134/67 (!) 145/83  Pulse: (!) 102 (!) 102 98 100  Resp: (!) 28 (!) 28 (!) 25 (!) 28  Temp:      TempSrc:      SpO2: 96% 97% 97% 94%   General -  WNWD woman who is marked pain. Eyes: PERRL, lids and conjunctivae normal ENMT: Mucous membranes are moist. Posterior pharynx clear of any  exudate or lesions. Dentition - fair condition, missing several teeth. .  Neck: normal, supple, no masses, no thyromegaly Respiratory: clear to auscultation bilaterally, no wheezing, no crackles. Normal respiratory effort. No accessory muscle use.  Cardiovascular: Regular rate and rhythm, no murmurs / rubs / gallops. No extremity edema. 2+ pedal pulses. No carotid bruits.  Abdomen: Marked  Tenderness left mid-abdomen towards flank to deep palpation, mild discomfort  on the right, no guarding or rebound, no masses palpated. No hepatosplenomegaly. Bowel sounds positive.  Musculoskeletal: no clubbing / cyanosis. No joint deformity upper and lower extremities. Good ROM, no contractures. Normal muscle tone. Tender to palpation and percussion from mid-back (T10) to lower back L3. No tenderness laterally, No palpable deformity. Straight leg lift right causes back pain but no pain with leg flexion. No pain with straight leg lift left. Did not stand, ambulate or flex. Skin: no rashes, lesions, ulcers. No induration Neurologic: CN 2-12 grossly intact. Sensation intact, Strength 5/5 in all 4.  Psychiatric: Normal judgment and insight. Alert and oriented x 3. Normal mood.     Labs on Admission: I have personally reviewed following labs and imaging studies  CBC: Recent Labs  Lab 07/01/19 1845  WBC 14.0*  HGB 14.2  HCT 43.9  MCV 95.9  PLT 035   Basic Metabolic Panel: Recent Labs  Lab 07/01/19 1845  NA 143  K 3.3*  CL 107  CO2 25  GLUCOSE 136*  BUN 20  CREATININE 0.65  CALCIUM 8.7*   GFR: CrCl cannot be calculated (Unknown ideal weight.). Liver Function Tests: Recent Labs  Lab 07/01/19 1845  AST 26  ALT 22  ALKPHOS 74  BILITOT 0.7  PROT 7.0  ALBUMIN 4.1   No results for input(s): LIPASE, AMYLASE in the last 168 hours. No results for input(s): AMMONIA in the last 168 hours. Coagulation Profile: No results for input(s): INR, PROTIME in the last 168 hours. Cardiac Enzymes: No  results for input(s): CKTOTAL, CKMB, CKMBINDEX, TROPONINI in the last 168 hours. BNP (last 3 results) No results for input(s): PROBNP in the last 8760 hours. HbA1C: No results for input(s): HGBA1C in the last 72 hours. CBG: No results for input(s): GLUCAP in the last 168 hours. Lipid Profile: No results for input(s): CHOL, HDL, LDLCALC, TRIG, CHOLHDL, LDLDIRECT in the last 72 hours. Thyroid Function Tests: No results for input(s): TSH, T4TOTAL, FREET4, T3FREE, THYROIDAB in the last 72 hours. Anemia Panel: No results for input(s): VITAMINB12, FOLATE, FERRITIN, TIBC, IRON, RETICCTPCT in the last 72 hours. Urine analysis:    Component Value Date/Time   COLORURINE YELLOW 07/01/2019 2229   APPEARANCEUR CLEAR 07/01/2019 2229   LABSPEC 1.023 07/01/2019 2229   PHURINE 5.0 07/01/2019 2229   GLUCOSEU NEGATIVE 07/01/2019 2229   HGBUR MODERATE (A) 07/01/2019 2229   BILIRUBINUR NEGATIVE 07/01/2019 2229   KETONESUR 20 (A) 07/01/2019 2229   PROTEINUR NEGATIVE 07/01/2019 2229   NITRITE POSITIVE (A) 07/01/2019 2229   LEUKOCYTESUR TRACE (A) 07/01/2019 2229    Radiological Exams on Admission: DG Chest 1 View  Result Date: 07/01/2019 CLINICAL DATA:  Lower back pain for 15 years, increasing today EXAM: CHEST  1 VIEW COMPARISON:  04/04/2018 FINDINGS: Single frontal view of the chest demonstrates an unremarkable cardiac silhouette. No airspace disease, effusion, or pneumothorax. No acute displaced fractures. IMPRESSION: 1. No acute intrathoracic process. Electronically Signed   By: Randa Ngo M.D.   On: 07/01/2019 19:22   DG Lumbar Spine Complete  Result Date: 07/01/2019 CLINICAL DATA:  Chronic low back pain for 15 years, worsening today EXAM: LUMBAR SPINE - COMPLETE 4+ VIEW COMPARISON:  04/04/2018 FINDINGS: Frontal, bilateral oblique, lateral views of the lumbar spine are obtained. There is transitional anatomy, with sacralization of the L5 vertebral body and hypoplastic twelfth ribs. There is a  chronic compression deformity involving the T12 vertebral body with 50% loss of height, stable. No acute bony abnormalities. Significant spondylosis and facet  hypertrophy within the lower lumbar spine greatest at L4-5 and L5-S1. Sacroiliac joints are normal. 8 mm calculus lower pole left kidney again noted not appreciably changed. Vascular calcifications throughout the aorta. IMPRESSION: 1. Stable 8 mm left renal calculus. 2. Chronic T12 compression deformity. 3. No acute bony abnormality. 4. Stable prominent spondylosis and facet hypertrophy at the lumbosacral junction. Electronically Signed   By: Randa Ngo M.D.   On: 07/01/2019 19:25   CT ABDOMEN PELVIS W CONTRAST  Result Date: 07/01/2019 CLINICAL DATA:  Low back pain EXAM: CT ABDOMEN AND PELVIS WITH CONTRAST TECHNIQUE: Multidetector CT imaging of the abdomen and pelvis was performed using the standard protocol following bolus administration of intravenous contrast. CONTRAST:  127mL OMNIPAQUE IOHEXOL 300 MG/ML  SOLN COMPARISON:  04/04/2018 FINDINGS: Lower chest: Small hiatal hernia.  No acute abnormality. Hepatobiliary: Scattered cysts within the liver. Gallbladder is mildly distended., similar to prior study. No ductal dilatation. Pancreas: No focal abnormality or ductal dilatation. Spleen: No focal abnormality.  Normal size. Adrenals/Urinary Tract: Bilateral renal cysts. 7 mm stone in the lower pole of the left kidney. No ureteral stones or hydronephrosis. Urinary bladder and adrenal glands unremarkable. Stomach/Bowel: Left colonic diverticulosis. No active diverticulitis. Appendix is normal. Stomach and small bowel unremarkable. Vascular/Lymphatic: Aortic atherosclerosis. No enlarged abdominal or pelvic lymph nodes. Reproductive: Prior hysterectomy.  No adnexal masses. Other: No free fluid or free air. Musculoskeletal: No acute bony abnormality. Degenerative changes in the lumbar spine. Stable mild compression fracture at L1. IMPRESSION: Bilateral  renal and hepatic cysts. Left lower pole nephrolithiasis. No ureteral stones or hydronephrosis. Left colonic diverticulosis.  No active diverticulitis. Aortic atherosclerosis. Electronically Signed   By: Rolm Baptise M.D.   On: 07/01/2019 22:00   CT L-SPINE NO CHARGE  Result Date: 07/01/2019 CLINICAL DATA:  Low back pain for 15 years that worsened today. EXAM: CT LUMBAR SPINE WITHOUT CONTRAST TECHNIQUE: Multidetector CT imaging of the lumbar spine was performed without intravenous contrast administration. Multiplanar CT image reconstructions were also generated. COMPARISON:  Lumbar spine MRI 01/22/2015 FINDINGS: Segmentation: 5 lumbar type vertebrae. Alignment: Normal. Vertebrae: Chronic L1 compression deformity.  No acute fracture. Paraspinal and other soft tissues: Calcific aortic atherosclerosis. Left lower pole renal calculus measures 7 mm. Disc levels: There is no spinal canal stenosis. At L5-S1, there is moderate right foraminal stenosis due to endplate spurring and disc bulge. IMPRESSION: 1. No acute fracture or static subluxation of the lumbar spine. 2. Chronic L1 compression deformity. 3. Moderate right L5-S1 foraminal stenosis due to endplate spurring and disc bulge. 4. Aortic Atherosclerosis (ICD10-I70.0). Electronically Signed   By: Ulyses Jarred M.D.   On: 07/01/2019 22:07    EKG: Independently reviewed. Sinus tachycardia  Assessment/Plan Active Problems:   Low back pain at multiple sites   Hypertension   HLD (hyperlipidemia)   COPD GOLD II with reversibility   (please populate well all problems here in Problem List. (For example, if patient is on BP meds at home and you resume or decide to hold them, it is a problem that needs to be her. Same for CAD, COPD, HLD and so on)   1. Low back pain - patient describes severe low back pain. ON exam she has moderate tenderness to palpation and percussion from T10-L3 without paraspinal tenderness. She has pain with straight leg lift right. She  also has marked tenderness to deep palpation left mid-abdomen. Imaging w/o acute findings including absence of ureteral stone left. Pain has not been relieved with narcotics.  No  significant focal deficits. Plan Med-surg observation  Pain control - will administer Ketorolac IV q 6  Follow up exam - for continued severe pain will consider MRI Spine  2. HTN- continue home meds  3. COPD - has had cancer screening low dose CT in the last year. She appears to be stable Plan Continue home regimen  4. Code status - discussed with patient - full code    DVT prophylaxis:  Lovenox Code Status: full code  Family Communication: patient requested that her son not be called due to the late hour  Disposition Plan: HOme when pain controlled and eval complete - anticipate 24-48 hours  Consults called: none (with names) Admission status: observation    Adella Hare MD [facility] Pager [phone]  If 7PM-7AM, please contact night-coverage [email or web address] Password [facility]  07/02/2019, 12:50 AM

## 2019-07-02 NOTE — Evaluation (Signed)
Physical Therapy Evaluation Patient Details Name: Sherri King MRN: [id number] DOB: 05-12-1940 Today's Date: 07/02/2019   History of Present Illness  Sherri King is a 79 y.o. female with medical history significant of cervical cancer , COPD-GOLD II, GERD, hypertension, anxiety presents to the ER with sudden onset of lower back pain.CT abd/pelvis stable stone left kidney lowerpole, Lumbar spine - chronic T12 compression  Clinical Impression  The patient mobilized with complaints of pain but became  Nauseated after ambulation.Patient reports emesis several times today. Patient should progress to return to independent level to return home. Encouraged patient to call for assistance and ambulate to BR versus use of Purewick.=which was in place.    Follow Up Recommendations No PT follow up    Equipment Recommendations  None recommended by PT    Recommendations for Other Services       Precautions / Restrictions Precautions Precautions: Fall      Mobility  Bed Mobility Overal bed mobility: Independent                Transfers Overall transfer level: Needs assistance Equipment used: None Transfers: Sit to/from Stand Sit to Stand: Min guard         General transfer comment: patient appeared to have  a balance loss vs not paying attention/impulse  Ambulation/Gait Ambulation/Gait assistance: Min guard;Min assist Gait Distance (Feet): 125 Feet Assistive device: 1 person hand held assist Gait Pattern/deviations: Step-through pattern;Drifts right/left Gait velocity: decr   General Gait Details: at times offered min assist and HHA. Patient became nauseated with ambulation  Stairs            Wheelchair Mobility    Modified Rankin (Stroke Patients Only)       Balance Overall balance assessment: Mild deficits observed, not formally tested                                           Pertinent Vitals/Pain      Home Living  Family/patient expects to be discharged to:: Private residence Living Arrangements: Children Available Help at Discharge: Family Type of Home: House Home Access: Stairs to enter Entrance Stairs-Rails: Psychiatric nurse of Steps: 3-5 Home Layout: One level Home Equipment: None      Prior Function Level of Independence: Independent         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     Hand Dominance   Dominant Hand: Right    Extremity/Trunk Assessment   Upper Extremity Assessment Upper Extremity Assessment: Overall WFL for tasks assessed    Lower Extremity Assessment Lower Extremity Assessment: Generalized weakness    Cervical / Trunk Assessment Cervical / Trunk Assessment: Normal  Communication   Communication: No difficulties  Cognition Arousal/Alertness: Awake/alert Behavior During Therapy: Impulsive Overall Cognitive Status: Within Functional Limits for tasks assessed                                        General Comments      Exercises     Assessment/Plan    PT Assessment Patient needs continued PT services  PT Problem List Decreased strength;Decreased balance;Decreased mobility;Decreased safety awareness;Decreased activity tolerance       PT Treatment Interventions DME instruction;Therapeutic activities;Gait training;Functional mobility training;Patient/family education    PT Goals (Current goals can  be found in the Care Plan section)  Acute Rehab PT Goals Patient Stated Goal: to get back to my flower bed PT Goal Formulation: With patient Time For Goal Achievement: 07/09/19 Potential to Achieve Goals: Good    Frequency Min 3X/week   Barriers to discharge        Co-evaluation               AM-PAC PT "6 Clicks" Mobility  Outcome Measure Help needed turning from your back to your side while in a flat bed without using bedrails?: None Help needed moving from lying on your back to sitting on the side of a  flat bed without using bedrails?: None Help needed moving to and from a bed to a chair (including a wheelchair)?: A Little Help needed standing up from a chair using your arms (e.g., wheelchair or bedside chair)?: A Little Help needed to walk in hospital room?: A Little Help needed climbing 3-5 steps with a railing? : A Little 6 Click Score: 20    End of Session   Activity Tolerance: Treatment limited secondary to medical complications (Comment) Patient left: in bed;with call bell/phone within reach;with family/visitor present;with bed alarm set Nurse Communication: Mobility status PT Visit Diagnosis: Unsteadiness on feet (R26.81)    Time: 2761-4709 PT Time Calculation (min) (ACUTE ONLY): 14 min   Charges:   PT Evaluation $PT Eval Low Complexity: [place] Pager [phone] Office [phone]   Claretha Cooper 07/02/2019, 1:19 PM

## 2019-07-03 DIAGNOSIS — R3911 Hesitancy of micturition: Secondary | ICD-10-CM | POA: Diagnosis not present

## 2019-07-03 DIAGNOSIS — M545 Low back pain: Secondary | ICD-10-CM | POA: Diagnosis not present

## 2019-07-03 LAB — CBC
HCT: 41.5 % (ref 36.0–46.0)
Hemoglobin: 13.3 g/dL (ref 12.0–15.0)
MCH: 31.2 pg (ref 26.0–34.0)
MCHC: 32 g/dL (ref 30.0–36.0)
MCV: 97.4 fL (ref 80.0–100.0)
Platelets: 135 10*3/uL — ABNORMAL LOW (ref 150–400)
RBC: 4.26 MIL/uL (ref 3.87–5.11)
RDW: 14.1 % (ref 11.5–15.5)
WBC: 11.8 10*3/uL — ABNORMAL HIGH (ref 4.0–10.5)
nRBC: 0 % (ref 0.0–0.2)

## 2019-07-03 LAB — BASIC METABOLIC PANEL
Anion gap: 8 (ref 5–15)
BUN: 32 mg/dL — ABNORMAL HIGH (ref 8–23)
CO2: 25 mmol/L (ref 22–32)
Calcium: 8.3 mg/dL — ABNORMAL LOW (ref 8.9–10.3)
Chloride: 106 mmol/L (ref 98–111)
Creatinine, Ser: 0.68 mg/dL (ref 0.44–1.00)
GFR calc Af Amer: >60 mL/min (ref >60)
GFR calc non Af Amer: >60 mL/min (ref >60)
Glucose, Bld: 127 mg/dL — ABNORMAL HIGH (ref 70–99)
Potassium: 3.8 mmol/L (ref 3.5–5.1)
Sodium: 139 mmol/L (ref 135–145)

## 2019-07-03 MED ORDER — SODIUM CHLORIDE 0.9 % IV SOLN
1.0000 g | Freq: Once | INTRAVENOUS | Status: AC
  Administered 2019-07-03: 1 g via INTRAVENOUS
  Filled 2019-07-03: qty 1

## 2019-07-03 MED ORDER — CEFDINIR 300 MG PO CAPS
300.0000 mg | ORAL_CAPSULE | Freq: Two times a day (BID) | ORAL | 0 refills | Status: AC
Start: 2019-07-04 — End: 2019-07-09

## 2019-07-03 NOTE — Discharge Summary (Signed)
Physician Discharge Summary  [name] ION:[id number] DOB: July 05, 1940 DOA: 07/01/2019  PCP: Deland Pretty, MD  Admit date: 07/01/2019 Discharge date: 07/03/2019  Admitted From: Home Disposition: Home  Recommendations for Outpatient Follow-up:  1. Follow up with PCP in 1 week 2. Please follow up on the following pending results: Urine culture  Home Health: None Equipment/Devices: None  Discharge Condition: Stable CODE STATUS: Full code Diet recommendation: Heart healthy   Brief/Interim Summary:  Admission HPI written by Neena Rhymes, MD  Chief Complaint: severe back pain  HPI: Sherri King is a 79 y.o. female with medical history significant of cervical cancer, COPD-GOLD II, GERD, hypertension, anxiety presents to the ER with sudden onset of lower back pain. Patient states that she has had low back pain for 15 years, however today she had a sudden sharp onset of stabbing pain after sitting down in a chair. Patient states that she was up and about doing her normal routine of going to church, and putting a pot roast in the slow cooker, when she sat down and felt a sudden sharp jolt. She is not sure if this feels like her typical low back pain that has been exacerbated, or if this is a different kind of pain. She denies any groin numbness, radiation into her legs, no bowel incontinence..States that she has frequent urinary incontinence, but this is chronic. She denies any night sweats, fevers, chills or unintended weight loss. She states that she has been able to walk to the bathroom, but this has been very painful. She denies any chest pain, states that she feels a little short of breath but with her history of COPD this is not unusual. She has not taken anything for pain.    Hospital course:  Acute on chronic back pain Thoracic back pain. Required IV analgesics on admission and has resolved. CT thoracic and lumbar spine without acute processes.  History of compression fractures. Unsure of etiology. Will need outpatient PCP follow-up.  Urinary hesitancy Possible UTI. Patient started empirically on ceftriaxone. Urine culture obtained (6/28) and too early to read. Patient received two doses of Ceftriaxone and will discharge on Cefdinir 300 mg BID x5 more days to complete a 7 day course.  Discharge Diagnoses:  Active Problems:   Hypertension   HLD (hyperlipidemia)   COPD GOLD II with reversibility    Low back pain at multiple sites   Acute low back pain    Discharge Instructions  Discharge Instructions    Call MD for:  severe uncontrolled pain   Complete by: As directed    Increase activity slowly   Complete by: As directed      Allergies as of 07/03/2019      Reactions   Bee Venom Diarrhea, Nausea And Vomiting, Swelling      Medication List    TAKE these medications   acetaminophen 500 MG tablet Commonly known as: TYLENOL Take 500 mg by mouth every 6 (six) hours as needed for mild pain.   aspirin EC 81 MG tablet Take 81 mg by mouth daily.   carboxymethylcellulose 0.5 % Soln Commonly known as: REFRESH PLUS Place 1 drop into both eyes 2 (two) times daily as needed (dryness).   cefdinir 300 MG capsule Commonly known as: OMNICEF Take 1 capsule (300 mg total) by mouth 2 (two) times daily for 5 days. Start taking on: July 04, 2019   cholecalciferol 1000 units tablet Commonly known as: VITAMIN D Take 1,000 Units by mouth  daily.   multivitamin with minerals tablet Take 1 tablet by mouth daily.   nitroGLYCERIN 0.4 MG SL tablet Commonly known as: NITROSTAT Place 1 tablet (0.4 mg total) under the tongue every 5 (five) minutes as needed for chest pain.   omeprazole 20 MG capsule Commonly known as: PRILOSEC Take 1 capsule (20 mg total) by mouth daily for 14 days.   rosuvastatin 5 MG tablet Commonly known as: CRESTOR Take 5 mg by mouth daily.   sertraline 50 MG tablet Commonly known as: ZOLOFT Take 50 mg by  mouth daily as needed (anxiety).   traMADol 50 MG tablet Commonly known as: ULTRAM Take 50-100 mg by mouth every 12 (twelve) hours as needed for moderate pain.   Trelegy Ellipta 100-62.5-25 MCG/INH Aepb Generic drug: Fluticasone-Umeclidin-Vilant Inhale 1 puff into the lungs daily.       Follow-up Information    Deland Pretty, MD. Schedule an appointment as soon as possible for a visit in 1 week(s).   Specialty: Internal Medicine Why: Hospital follow-up Contact information: [place] [address] [phone]              Allergies  Allergen Reactions  . Bee Venom Diarrhea, Nausea And Vomiting and Swelling    Consultations:  None   Procedures/Studies: DG Chest 1 View  Result Date: 07/01/2019 CLINICAL DATA:  Lower back pain for 15 years, increasing today EXAM: CHEST  1 VIEW COMPARISON:  04/04/2018 FINDINGS: Single frontal view of the chest demonstrates an unremarkable cardiac silhouette. No airspace disease, effusion, or pneumothorax. No acute displaced fractures. IMPRESSION: 1. No acute intrathoracic process. Electronically Signed   By: Randa Ngo M.D.   On: 07/01/2019 19:22   DG Lumbar Spine Complete  Result Date: 07/01/2019 CLINICAL DATA:  Chronic low back pain for 15 years, worsening today EXAM: LUMBAR SPINE - COMPLETE 4+ VIEW COMPARISON:  04/04/2018 FINDINGS: Frontal, bilateral oblique, lateral views of the lumbar spine are obtained. There is transitional anatomy, with sacralization of the L5 vertebral body and hypoplastic twelfth ribs. There is a chronic compression deformity involving the T12 vertebral body with 50% loss of height, stable. No acute bony abnormalities. Significant spondylosis and facet hypertrophy within the lower lumbar spine greatest at L4-5 and L5-S1. Sacroiliac joints are normal. 8 mm calculus lower pole left kidney again noted not appreciably changed. Vascular calcifications throughout the aorta. IMPRESSION: 1.  Stable 8 mm left renal calculus. 2. Chronic T12 compression deformity. 3. No acute bony abnormality. 4. Stable prominent spondylosis and facet hypertrophy at the lumbosacral junction. Electronically Signed   By: Randa Ngo M.D.   On: 07/01/2019 19:25   CT THORACIC SPINE WO CONTRAST  Result Date: 07/02/2019 CLINICAL DATA:  Mid back pain. EXAM: CT THORACIC SPINE WITHOUT CONTRAST TECHNIQUE: Multidetector CT images of the thoracic were obtained using the standard protocol without intravenous contrast. COMPARISON:  Lumbar CT done yesterday.  CT chest 08/14/2018. FINDINGS: Alignment: No significant scoliosis. Mildly prominent kyphotic curvature, fairly typical for age. Vertebrae: Old minor compression deformity seen at T6 and L1, unchanged since August of 2020. No evidence of recent or progressive compression fracture Paraspinal and other soft tissues: Aortic atherosclerosis. Disc levels: No significant degenerative disc disease. No apparent stenosis of the canal or foramina. No significant facet osteoarthritis. IMPRESSION: No evidence of recent compression fracture. Old minor compression fractures at T6 and L1, unchanged since at least August of 2020. No evidence of any discernibly significant degenerative disease in the thoracic region. Electronically Signed  By: Nelson Chimes M.D.   On: 07/02/2019 17:08   CT ABDOMEN PELVIS W CONTRAST  Result Date: 07/01/2019 CLINICAL DATA:  Low back pain EXAM: CT ABDOMEN AND PELVIS WITH CONTRAST TECHNIQUE: Multidetector CT imaging of the abdomen and pelvis was performed using the standard protocol following bolus administration of intravenous contrast. CONTRAST:  12mL OMNIPAQUE IOHEXOL 300 MG/ML  SOLN COMPARISON:  04/04/2018 FINDINGS: Lower chest: Small hiatal hernia.  No acute abnormality. Hepatobiliary: Scattered cysts within the liver. Gallbladder is mildly distended., similar to prior study. No ductal dilatation. Pancreas: No focal abnormality or ductal dilatation.  Spleen: No focal abnormality.  Normal size. Adrenals/Urinary Tract: Bilateral renal cysts. 7 mm stone in the lower pole of the left kidney. No ureteral stones or hydronephrosis. Urinary bladder and adrenal glands unremarkable. Stomach/Bowel: Left colonic diverticulosis. No active diverticulitis. Appendix is normal. Stomach and small bowel unremarkable. Vascular/Lymphatic: Aortic atherosclerosis. No enlarged abdominal or pelvic lymph nodes. Reproductive: Prior hysterectomy.  No adnexal masses. Other: No free fluid or free air. Musculoskeletal: No acute bony abnormality. Degenerative changes in the lumbar spine. Stable mild compression fracture at L1. IMPRESSION: Bilateral renal and hepatic cysts. Left lower pole nephrolithiasis. No ureteral stones or hydronephrosis. Left colonic diverticulosis.  No active diverticulitis. Aortic atherosclerosis. Electronically Signed   By: Rolm Baptise M.D.   On: 07/01/2019 22:00   CT L-SPINE NO CHARGE  Result Date: 07/01/2019 CLINICAL DATA:  Low back pain for 15 years that worsened today. EXAM: CT LUMBAR SPINE WITHOUT CONTRAST TECHNIQUE: Multidetector CT imaging of the lumbar spine was performed without intravenous contrast administration. Multiplanar CT image reconstructions were also generated. COMPARISON:  Lumbar spine MRI 01/22/2015 FINDINGS: Segmentation: 5 lumbar type vertebrae. Alignment: Normal. Vertebrae: Chronic L1 compression deformity.  No acute fracture. Paraspinal and other soft tissues: Calcific aortic atherosclerosis. Left lower pole renal calculus measures 7 mm. Disc levels: There is no spinal canal stenosis. At L5-S1, there is moderate right foraminal stenosis due to endplate spurring and disc bulge. IMPRESSION: 1. No acute fracture or static subluxation of the lumbar spine. 2. Chronic L1 compression deformity. 3. Moderate right L5-S1 foraminal stenosis due to endplate spurring and disc bulge. 4. Aortic Atherosclerosis (ICD10-I70.0). Electronically Signed   By:  Ulyses Jarred M.D.   On: 07/01/2019 22:07   MM DIAG BREAST TOMO BILATERAL  Result Date: 06/20/2019 CLINICAL DATA:  Intermittent shooting pains extending from the back to the front of both breasts for the past 3-4 months. Status post left breast excisional biopsy in 2016. EXAM: DIGITAL DIAGNOSTIC BILATERAL MAMMOGRAM WITH TOMO AND CAD COMPARISON:  Previous exam(s). ACR Breast Density Category c: The breast tissue is heterogeneously dense, which may obscure small masses. FINDINGS: Stable mammographic appearance of the breasts with no findings suspicious for malignancy in either breast. Mammographic images were processed with CAD. IMPRESSION: No evidence of malignancy. RECOMMENDATION: Bilateral screening mammogram in 1 year. I have discussed the findings and recommendations with the patient. If applicable, a reminder letter will be sent to the patient regarding the next appointment. BI-RADS CATEGORY  1: Negative. Electronically Signed   By: Claudie Revering M.D.   On: 06/20/2019 13:55     Subjective: No back pain today.  Discharge Exam: Vitals:   07/02/19 2038 07/03/19 0632  BP: 120/61 118/74  Pulse: 87 68  Resp: 16 17  Temp: 99.6 F (37.6 C) 98.1 F (36.7 C)  SpO2: 91% 95%   Vitals:   07/02/19 1450 07/02/19 1830 07/02/19 2038 07/03/19 0632  BP: 116/63 117/64 120/61  118/74  Pulse: 78 81 87 68  Resp: 18 18 16 17   Temp: 98.3 F (36.8 C) 99.6 F (37.6 C) 99.6 F (37.6 C) 98.1 F (36.7 C)  TempSrc: Oral Oral Oral Oral  SpO2: 91% 94% 91% 95%  Weight:      Height:         General: Pt is alert, awake, not in acute distress Cardiovascular: RRR, S1/S2 +, no rubs, no gallops Respiratory: CTA bilaterally, no wheezing, no rhonchi Abdominal: Soft, NT, ND, bowel sounds + Extremities: no edema, no cyanosis    The results of significant diagnostics from this hospitalization (including imaging, microbiology, ancillary and laboratory) are listed below for reference.     Microbiology: Recent  Results (from the past 240 hour(s))  SARS Coronavirus 2 by RT PCR (hospital order, performed in [facility] hospital lab) Nasopharyngeal Nasopharyngeal Swab     Status: None   Collection Time: 07/02/19  2:13 AM   Specimen: Nasopharyngeal Swab  Result Value Ref Range Status   SARS Coronavirus 2 NEGATIVE NEGATIVE Final    Comment: (NOTE) SARS-CoV-2 target nucleic acids are NOT DETECTED.  The SARS-CoV-2 RNA is generally detectable in upper and lower respiratory specimens during the acute phase of infection. The lowest concentration of SARS-CoV-2 viral copies this assay can detect is 250 copies / mL. A negative result does not preclude SARS-CoV-2 infection and should not be used as the sole basis for treatment or other patient management decisions.  A negative result may occur with improper specimen collection / handling, submission of specimen other than nasopharyngeal swab, presence of viral mutation(s) within the areas targeted by this assay, and inadequate number of viral copies (<250 copies / mL). A negative result must be combined with clinical observations, patient history, and epidemiological information.  Fact Sheet for Patients:   StrictlyIdeas.no  Fact Sheet for Healthcare Providers: BankingDealers.co.za  This test is not yet approved or  cleared by the [place] FDA and has been authorized for detection and/or diagnosis of SARS-CoV-2 by FDA under an Emergency Use Authorization (EUA).  This EUA will remain in effect (meaning this test can be used) for the duration of the COVID-19 declaration under Section 564(b)(1) of the Act, 21 U.S.C. section 360bbb-3(b)(1), unless the authorization is terminated or revoked sooner.  Performed at [facility], [place] [address] [place], [place] [address]      Labs: BNP (last 3 results) No results for input(s): BNP in the last 8760 hours. Basic Metabolic  Panel: Recent Labs  Lab 07/01/19 1845 07/03/19 0550  NA 143 139  K 3.3* 3.8  CL 107 106  CO2 25 25  GLUCOSE 136* 127*  BUN 20 32*  CREATININE 0.65 0.68  CALCIUM 8.7* 8.3*   Liver Function Tests: Recent Labs  Lab 07/01/19 1845  AST 26  ALT 22  ALKPHOS 74  BILITOT 0.7  PROT 7.0  ALBUMIN 4.1   No results for input(s): LIPASE, AMYLASE in the last 168 hours. No results for input(s): AMMONIA in the last 168 hours. CBC: Recent Labs  Lab 07/01/19 1845 07/03/19 0550  WBC 14.0* 11.8*  HGB 14.2 13.3  HCT 43.9 41.5  MCV 95.9 97.4  PLT 152 135*   Cardiac Enzymes: No results for input(s): CKTOTAL, CKMB, CKMBINDEX, TROPONINI in the last 168 hours. BNP: Invalid input(s): POCBNP CBG: No results for input(s): GLUCAP in the last 168 hours. D-Dimer No results for input(s): DDIMER in the last 72 hours. Hgb A1c No results for input(s):  HGBA1C in the last 72 hours. Lipid Profile No results for input(s): CHOL, HDL, LDLCALC, TRIG, CHOLHDL, LDLDIRECT in the last 72 hours. Thyroid function studies No results for input(s): TSH, T4TOTAL, T3FREE, THYROIDAB in the last 72 hours.  Invalid input(s): FREET3 Anemia work up No results for input(s): VITAMINB12, FOLATE, FERRITIN, TIBC, IRON, RETICCTPCT in the last 72 hours. Urinalysis    Component Value Date/Time   COLORURINE YELLOW 07/01/2019 2229   APPEARANCEUR CLEAR 07/01/2019 2229   LABSPEC 1.023 07/01/2019 2229   PHURINE 5.0 07/01/2019 2229   GLUCOSEU NEGATIVE 07/01/2019 2229   HGBUR MODERATE (A) 07/01/2019 2229   BILIRUBINUR NEGATIVE 07/01/2019 2229   KETONESUR 20 (A) 07/01/2019 2229   PROTEINUR NEGATIVE 07/01/2019 2229   NITRITE POSITIVE (A) 07/01/2019 2229   LEUKOCYTESUR TRACE (A) 07/01/2019 2229   Sepsis Labs Invalid input(s): PROCALCITONIN,  WBC,  LACTICIDVEN Microbiology Recent Results (from the past 240 hour(s))  SARS Coronavirus 2 by RT PCR (hospital order, performed in [place] hospital lab) Nasopharyngeal  Nasopharyngeal Swab     Status: None   Collection Time: 07/02/19  2:13 AM   Specimen: Nasopharyngeal Swab  Result Value Ref Range Status   SARS Coronavirus 2 NEGATIVE NEGATIVE Final    Comment: (NOTE) SARS-CoV-2 target nucleic acids are NOT DETECTED.  The SARS-CoV-2 RNA is generally detectable in upper and lower respiratory specimens during the acute phase of infection. The lowest concentration of SARS-CoV-2 viral copies this assay can detect is 250 copies / mL. A negative result does not preclude SARS-CoV-2 infection and should not be used as the sole basis for treatment or other patient management decisions.  A negative result may occur with improper specimen collection / handling, submission of specimen other than nasopharyngeal swab, presence of viral mutation(s) within the areas targeted by this assay, and inadequate number of viral copies (<250 copies / mL). A negative result must be combined with clinical observations, patient history, and epidemiological information.  Fact Sheet for Patients:   StrictlyIdeas.no  Fact Sheet for Healthcare Providers: BankingDealers.co.za  This test is not yet approved or  cleared by the [place] FDA and has been authorized for detection and/or diagnosis of SARS-CoV-2 by FDA under an Emergency Use Authorization (EUA).  This EUA will remain in effect (meaning this test can be used) for the duration of the COVID-19 declaration under Section 564(b)(1) of the Act, 21 U.S.C. section 360bbb-3(b)(1), unless the authorization is terminated or revoked sooner.  Performed at [facility], [place] [address], [place], [place] [address]      SIGNED:   Cordelia Poche, MD [facility] 07/03/2019, 10:35 AM

## 2019-07-03 NOTE — Discharge Instructions (Signed)
Sherri King,  You were in the hospital because of severe back pain. This resolved over time. No reason for the back pain could be identified. Please follow-up with your primary care physician. It is possible you had a UTI, I will prescribe antibiotics for you on discharge.

## 2019-07-03 NOTE — Care Management Obs Status (Signed)
[facility] NOTIFICATION   Patient Details  Name: Sherri King MRN: [id number] Date of Birth: [birth year]   Medicare Observation Status Notification Given:  Yes    Lynnell Catalan, RN 07/03/2019, 10:51 AM

## 2019-07-05 LAB — URINE CULTURE: Culture: >=100000 — AB

## 2019-07-13 DIAGNOSIS — N390 Urinary tract infection, site not specified: Secondary | ICD-10-CM | POA: Diagnosis not present

## 2019-07-13 DIAGNOSIS — M546 Pain in thoracic spine: Secondary | ICD-10-CM | POA: Diagnosis not present

## 2019-07-19 ENCOUNTER — Encounter: Payer: Self-pay | Admitting: Family Medicine

## 2019-07-19 ENCOUNTER — Ambulatory Visit: Payer: Medicare Other | Admitting: Family Medicine

## 2019-07-19 ENCOUNTER — Other Ambulatory Visit: Payer: Self-pay

## 2019-07-19 VITALS — BP 120/82 | HR 81 | Ht 64.0 in | Wt 165.8 lb

## 2019-07-19 DIAGNOSIS — M549 Dorsalgia, unspecified: Secondary | ICD-10-CM

## 2019-07-19 NOTE — Progress Notes (Signed)
Subjective:    CC: Low back and T-spine pain  I, Molly Weber, LAT, ATC, am serving as scribe for Dr. Lynne Leader.  HPI: Pt is 79 y/o female presenting w/ c/o chronic mid-low back pain that recently worsened in late June 2021 after sitting down in a chair for a while.  She notes that she had been doing a lot of gardening and pulling W[place]ia vines about a week before her pain suddenly started.  She notes after her emergency room visit her pain has stopped.  She currently is pain-free.  Radiating pain: No LE numbness/tingling: Yes in her distal lower legs LE weakness: No Aggravating factors: No real pain since the initial injury   Diagnostic testing: CT T-spine and L-spine and L-spine- 07/01/19  Pertinent review of Systems: No fevers or chills  Relevant historical information: COPD former smoker   Objective:    Vitals:   07/19/19 1515  BP: 120/82  Pulse: 81  SpO2: 96%   General: Well Developed, well nourished, and in no acute distress.   MSK: L-spine normal-appearing nontender normal motion.  Lab and Radiology Results  EXAM: CT LUMBAR SPINE WITHOUT CONTRAST  TECHNIQUE: Multidetector CT imaging of the lumbar spine was performed without intravenous contrast administration. Multiplanar CT image reconstructions were also generated.  COMPARISON:  Lumbar spine MRI 01/22/2015  FINDINGS: Segmentation: 5 lumbar type vertebrae.  Alignment: Normal.  Vertebrae: Chronic L1 compression deformity.  No acute fracture.  Paraspinal and other soft tissues: Calcific aortic atherosclerosis. Left lower pole renal calculus measures 7 mm.  Disc levels: There is no spinal canal stenosis. At L5-S1, there is moderate right foraminal stenosis due to endplate spurring and disc bulge.  IMPRESSION: 1. No acute fracture or static subluxation of the lumbar spine. 2. Chronic L1 compression deformity. 3. Moderate right L5-S1 foraminal stenosis due to endplate spurring and  disc bulge. 4. Aortic Atherosclerosis (ICD10-I70.0).   Electronically Signed   By: Ulyses Jarred M.D.   On: 07/01/2019 22:07 EXAM: CT THORACIC SPINE WITHOUT CONTRAST  TECHNIQUE: Multidetector CT images of the thoracic were obtained using the standard protocol without intravenous contrast.  COMPARISON:  Lumbar CT done yesterday.  CT chest 08/14/2018.  FINDINGS: Alignment: No significant scoliosis. Mildly prominent kyphotic curvature, fairly typical for age.  Vertebrae: Old minor compression deformity seen at T6 and L1, unchanged since August of 2020. No evidence of recent or progressive compression fracture  Paraspinal and other soft tissues: Aortic atherosclerosis.  Disc levels: No significant degenerative disc disease. No apparent stenosis of the canal or foramina. No significant facet osteoarthritis.  IMPRESSION: No evidence of recent compression fracture. Old minor compression fractures at T6 and L1, unchanged since at least August of 2020.  No evidence of any discernibly significant degenerative disease in the thoracic region.   Electronically Signed   By: Nelson Chimes M.D.   On: 07/02/2019 17:08  I, Lynne Leader, personally (independently) visualized and performed the interpretation of the images attached in this note.   Impression and Recommendations:    Assessment and Plan: 79 y.o. female with acute exacerbation of diffuse back pain starting suddenly after sitting down.  This was obviously concerning for compression fracture.  She was seen in the emergency department and had extensive work-up including CT scan of her thoracic and lumbar spine.  This found old vertebral compression fractures but nothing new.  Fortunately the pain spontaneously resolved.  I suspect in retrospect that her pain was due to myofascial spasm and dysfunction.  As  she is pain-free now I am not sure there is much utility in physical therapy however should the pain return that  would be a good first choice.  Further imaging if needed may be MRI however right now that is not necessary.  Discussed case with patient and plan who expresses understanding and agreement..   Discussed warning signs or symptoms. Please see discharge instructions. Patient expresses understanding.   The above documentation has been reviewed and is accurate and complete Lynne Leader, M.D.

## 2019-07-19 NOTE — Patient Instructions (Signed)
Thank you for coming in today.  Continue home exercise and activity.  If the pain returns let me know.  We probably will try some PT.   I think this was a pulled muscle in your back that cramped.

## 2019-08-27 ENCOUNTER — Ambulatory Visit (INDEPENDENT_AMBULATORY_CARE_PROVIDER_SITE_OTHER)
Admission: RE | Admit: 2019-08-27 | Discharge: 2019-08-27 | Disposition: A | Discharge status: Home / Self Care | Payer: Medicare Other | Source: Ambulatory Visit | Attending: Acute Care | Admitting: Acute Care

## 2019-08-27 ENCOUNTER — Other Ambulatory Visit: Payer: Self-pay

## 2019-08-27 DIAGNOSIS — Z87891 Personal history of nicotine dependence: Secondary | ICD-10-CM | POA: Diagnosis not present

## 2019-08-27 DIAGNOSIS — Z122 Encounter for screening for malignant neoplasm of respiratory organs: Secondary | ICD-10-CM

## 2019-08-29 DIAGNOSIS — Z9009 Acquired absence of other part of head and neck: Secondary | ICD-10-CM | POA: Diagnosis not present

## 2019-08-29 DIAGNOSIS — E785 Hyperlipidemia, unspecified: Secondary | ICD-10-CM | POA: Diagnosis not present

## 2019-08-29 DIAGNOSIS — R7303 Prediabetes: Secondary | ICD-10-CM | POA: Diagnosis not present

## 2019-08-29 DIAGNOSIS — K219 Gastro-esophageal reflux disease without esophagitis: Secondary | ICD-10-CM | POA: Diagnosis not present

## 2019-08-30 NOTE — Progress Notes (Signed)
Please call patient and let them  know their  low dose Ct was read as a Lung RADS 2: nodules that are benign in appearance and behavior with a very low likelihood of becoming a clinically active cancer due to size or lack of growth. Recommendation per radiology is for a repeat LDCT in 12 months. .Please let them  know we will order and schedule their  annual screening scan for 08/2020. Please let them  know there was notation of CAD on their  scan.  Please remind the patient  that this is a non-gated exam therefore degree or severity of disease  cannot be determined. Please have them  follow up with their PCP regarding potential risk factor modification, dietary therapy or pharmacologic therapy if clinically indicated. Pt.  is  currently on statin therapy. Please place order for annual  screening scan for  08/2020 and fax results to PCP. Thanks so much. 

## 2019-09-03 DIAGNOSIS — M25561 Pain in right knee: Secondary | ICD-10-CM | POA: Diagnosis not present

## 2019-09-03 DIAGNOSIS — M25562 Pain in left knee: Secondary | ICD-10-CM | POA: Diagnosis not present

## 2019-09-03 DIAGNOSIS — M25461 Effusion, right knee: Secondary | ICD-10-CM | POA: Diagnosis not present

## 2019-09-04 DIAGNOSIS — M25561 Pain in right knee: Secondary | ICD-10-CM | POA: Diagnosis not present

## 2019-09-04 DIAGNOSIS — M1990 Unspecified osteoarthritis, unspecified site: Secondary | ICD-10-CM | POA: Diagnosis not present

## 2019-09-04 DIAGNOSIS — M1711 Unilateral primary osteoarthritis, right knee: Secondary | ICD-10-CM | POA: Diagnosis not present

## 2019-09-04 DIAGNOSIS — M25461 Effusion, right knee: Secondary | ICD-10-CM | POA: Diagnosis not present

## 2019-09-04 DIAGNOSIS — M1712 Unilateral primary osteoarthritis, left knee: Secondary | ICD-10-CM | POA: Diagnosis not present

## 2019-09-04 DIAGNOSIS — M2550 Pain in unspecified joint: Secondary | ICD-10-CM | POA: Diagnosis not present

## 2019-09-13 DIAGNOSIS — E785 Hyperlipidemia, unspecified: Secondary | ICD-10-CM | POA: Diagnosis not present

## 2019-09-13 DIAGNOSIS — R7303 Prediabetes: Secondary | ICD-10-CM | POA: Diagnosis not present

## 2019-09-13 DIAGNOSIS — Z87891 Personal history of nicotine dependence: Secondary | ICD-10-CM | POA: Diagnosis not present

## 2019-09-13 DIAGNOSIS — Z9009 Acquired absence of other part of head and neck: Secondary | ICD-10-CM | POA: Diagnosis not present

## 2019-09-13 DIAGNOSIS — M810 Age-related osteoporosis without current pathological fracture: Secondary | ICD-10-CM | POA: Diagnosis not present

## 2019-09-25 DIAGNOSIS — Z20822 Contact with and (suspected) exposure to covid-19: Secondary | ICD-10-CM | POA: Diagnosis not present

## 2019-09-29 DIAGNOSIS — Z20822 Contact with and (suspected) exposure to covid-19: Secondary | ICD-10-CM | POA: Diagnosis not present

## 2019-10-19 IMAGING — CT CT HEAD W/O CM
3 series · 15 of 47 positions shown, 18 images · non-contrast
Comparison: None.

CLINICAL DATA: Patient fell [DATE]. Patient is unsure if she hit
head or not, but has been having left parietal headaches since fall.
No LOC

EXAM:
CT HEAD WITHOUT CONTRAST
TECHNIQUE: Contiguous axial images were obtained from the base of the skull
through the vertex without intravenous contrast.

[Series 3: head 5.0 h30s · axial · 0.43mm/px · z∈[-67,+63]mm · 9 of 32 slices shown, 12 images]
[im 3/32  brain]
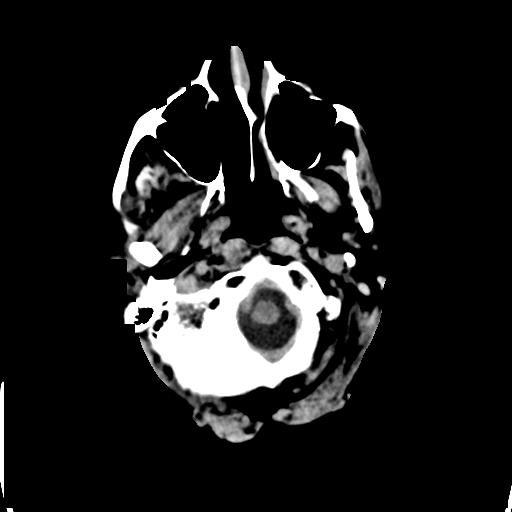
[im 3/32  bone]
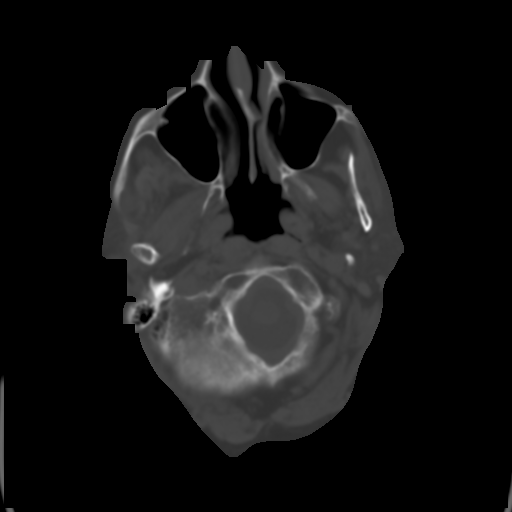
[im 6/32  brain]
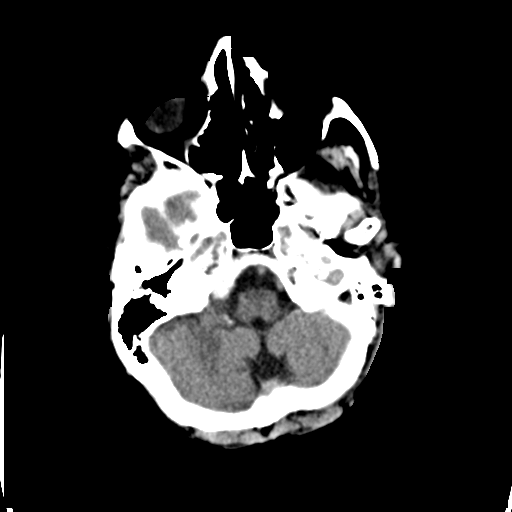
[im 9/32  brain]
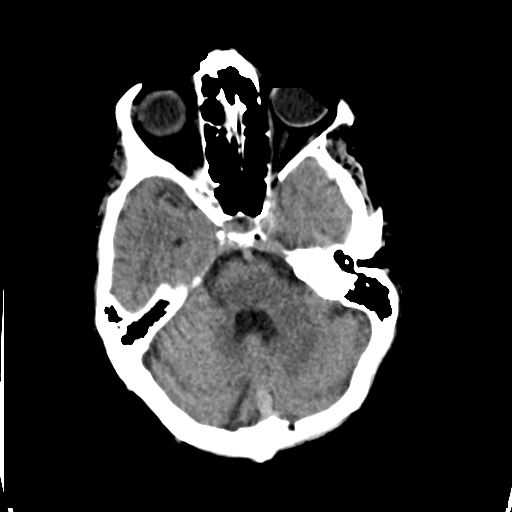
[im 12/32  brain]
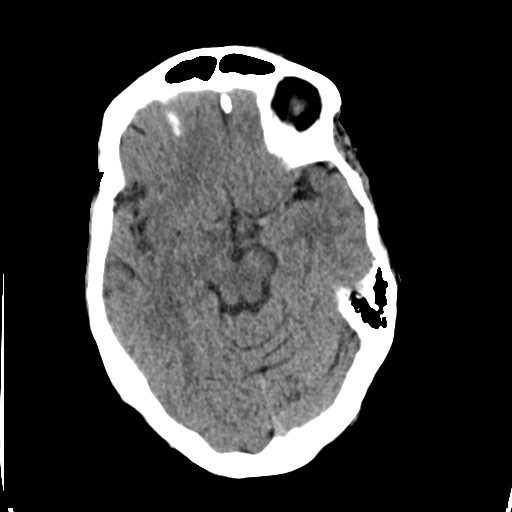
[im 17/32  brain]
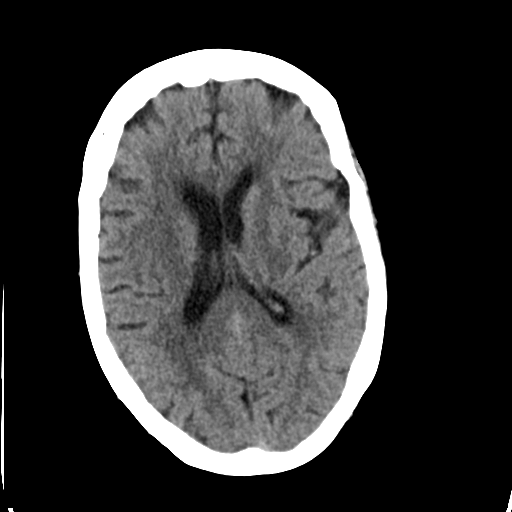
[im 17/32  bone]
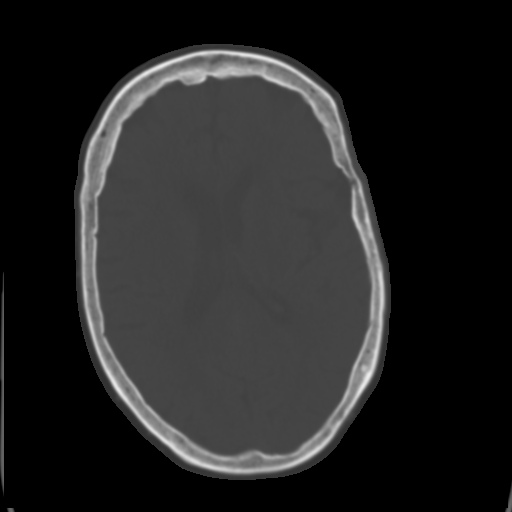
[im 20/32  brain]
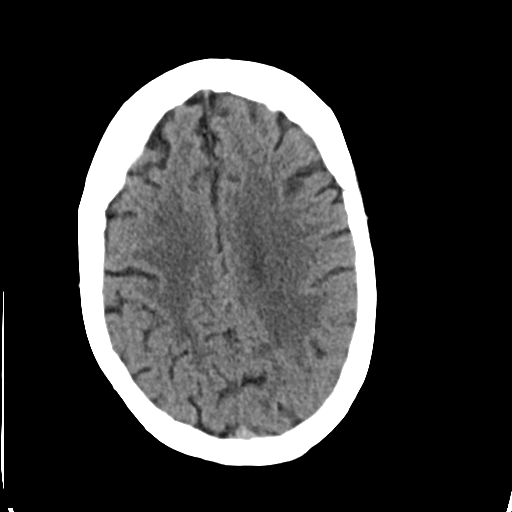
[im 23/32  brain]
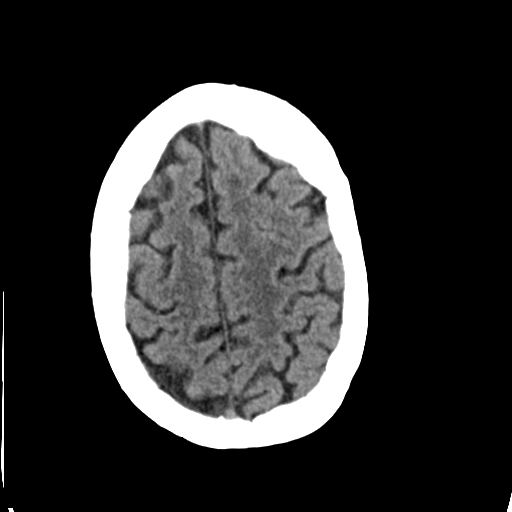
[im 26/32  brain]
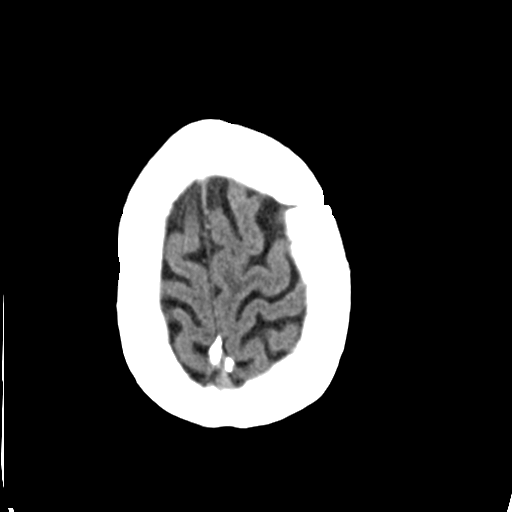
[im 29/32  brain]
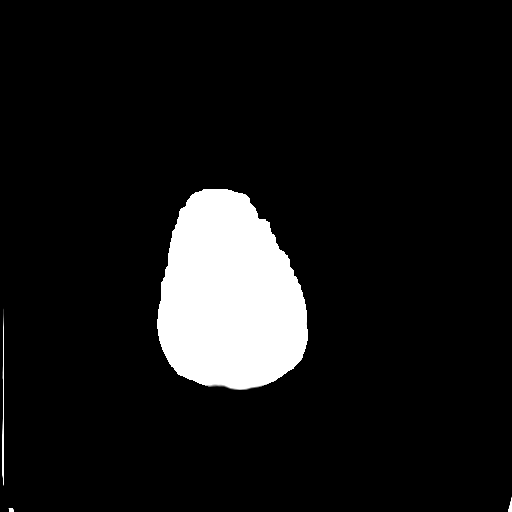
[im 29/32  bone]
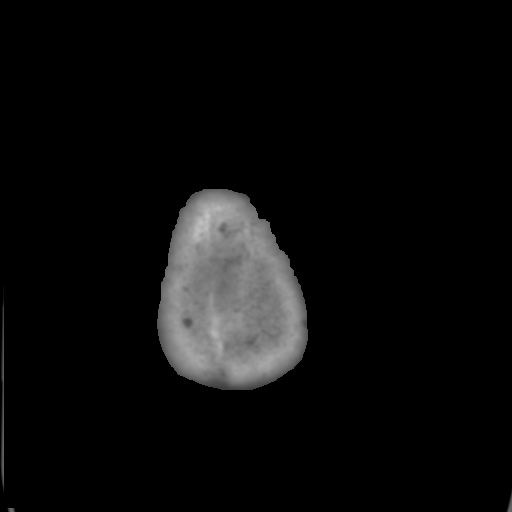

[Series 5: head 3.0 mpr cor · coronal · 0.30mm/px · 3 of 70 slices shown]
[im 24/70  brain]
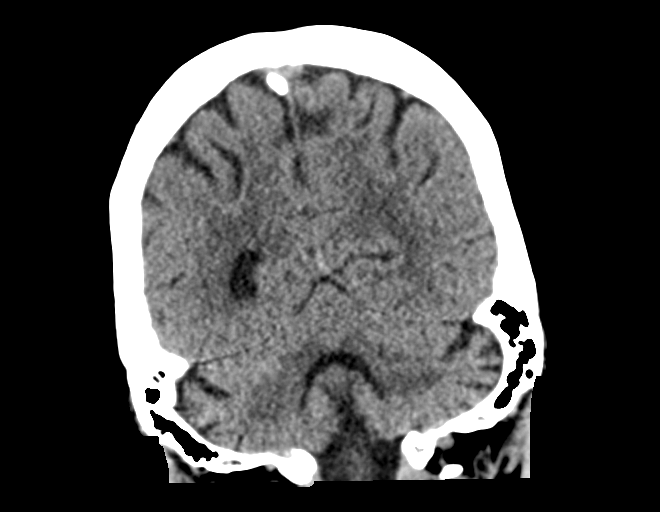
[im 31/70  brain]
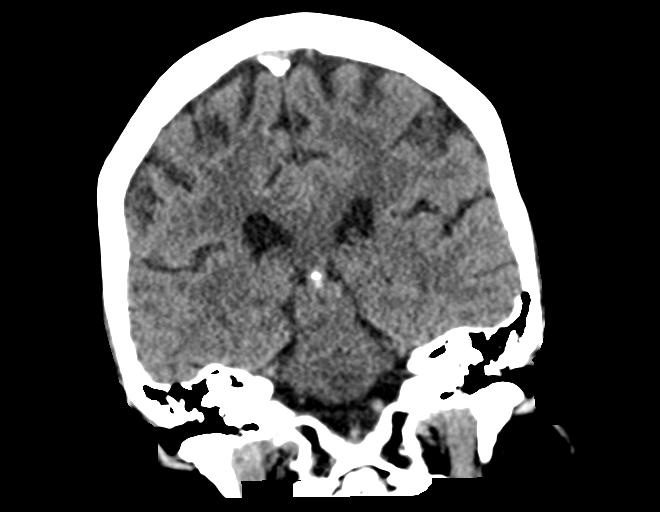
[im 39/70  brain]
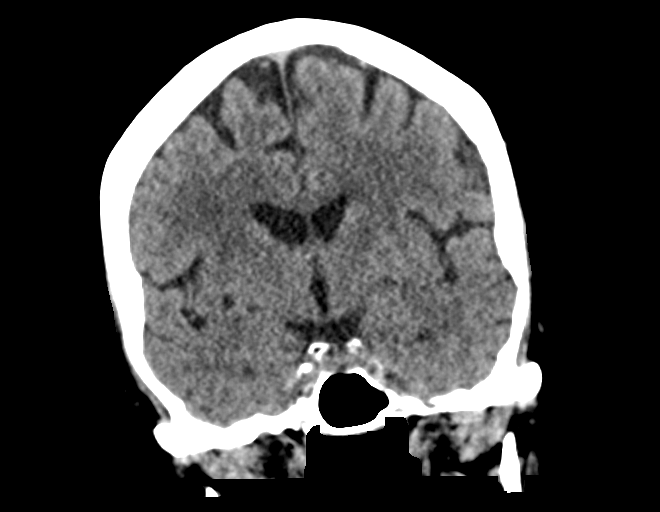

[Series 6: head 3.0 mpr sag · sagittal · 0.33mm/px · 3 of 59 slices shown]
[im 20/59  brain]
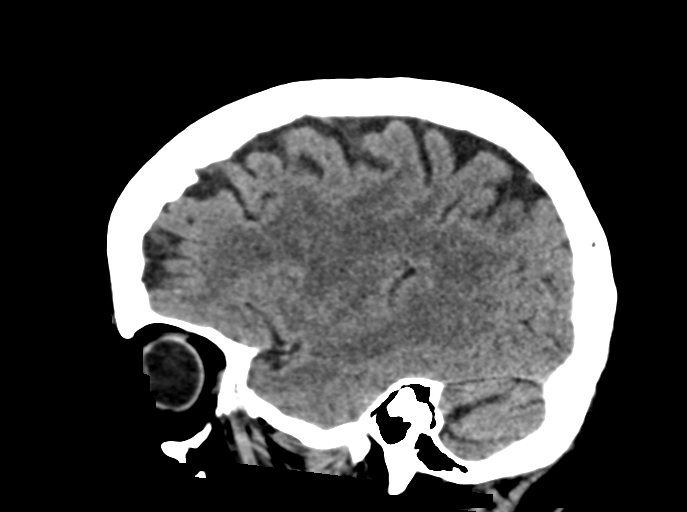
[im 30/59  brain]
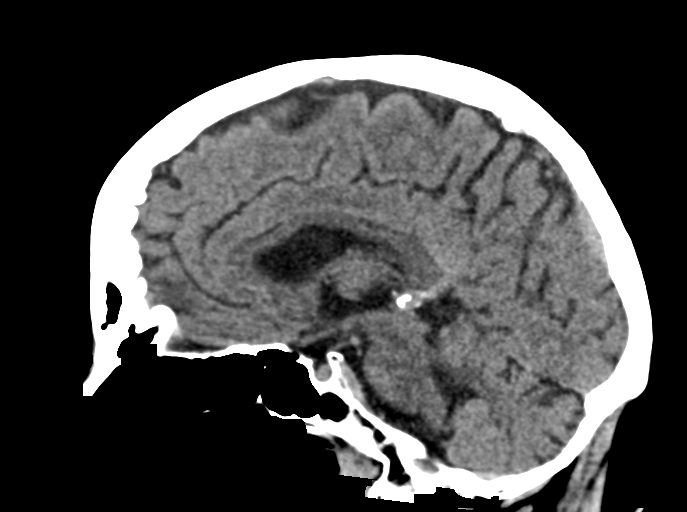
[im 39/59  brain]
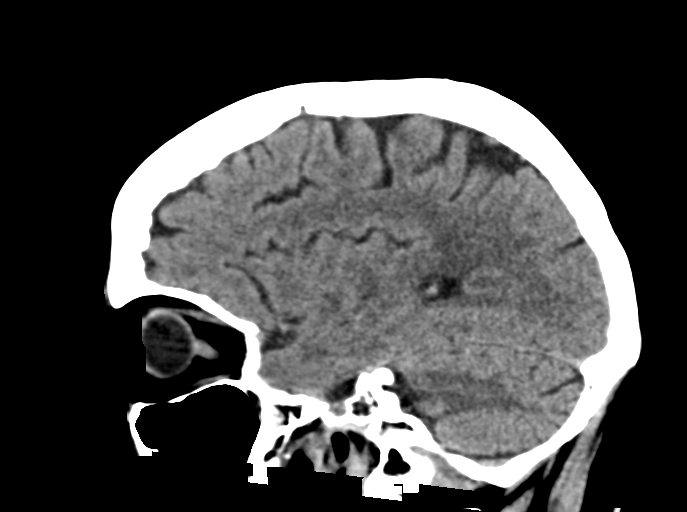

[15 of 47 positions shown; findings below may reference images not displayed]

FINDINGS: Brain: No evidence of acute infarction, hemorrhage, hydrocephalus,
extra-axial collection or mass lesion/mass effect.

There is mild patchy periventricular white matter hypoattenuation
consistent with chronic microvascular ischemic change.

Vascular: No hyperdense vessel or unexpected calcification.

Skull: Normal. Negative for fracture or focal lesion.

Sinuses/Orbits: Globes and orbits are unremarkable. Visualized
sinuses and mastoid air cells are clear.

Other: None.
IMPRESSION: 1. No acute intracranial abnormalities.
2. Mild chronic microvascular ischemic change.

## 2019-11-12 DIAGNOSIS — M810 Age-related osteoporosis without current pathological fracture: Secondary | ICD-10-CM | POA: Diagnosis not present

## 2019-12-07 DIAGNOSIS — Z20822 Contact with and (suspected) exposure to covid-19: Secondary | ICD-10-CM | POA: Diagnosis not present

## 2019-12-19 DIAGNOSIS — Z20822 Contact with and (suspected) exposure to covid-19: Secondary | ICD-10-CM | POA: Diagnosis not present

## 2019-12-21 DIAGNOSIS — Z20822 Contact with and (suspected) exposure to covid-19: Secondary | ICD-10-CM | POA: Diagnosis not present

## 2019-12-22 ENCOUNTER — Telehealth: Payer: Self-pay | Admitting: Family

## 2019-12-22 NOTE — Telephone Encounter (Signed)
Called to Discuss with patient about Covid symptoms and the use of the monoclonal antibody infusion for those with mild to moderate Covid symptoms and at a high risk of hospitalization.     Pt appears to qualify for this infusion due to co-morbid conditions and/or a member of an at-risk group in accordance with the FDA Emergency Use Authorization.   Qualifying risk factors include age, COPD, hyperlipidemia, and hypertension. Will need additional information as to when her symptoms started, current symptoms, and where she tested positive.   Left voicemail with hotline number as I was not able to reach her via phone. No MyChart has been set up.  Terri Piedra, NP 12/22/2019 6:09 PM

## 2020-01-08 DIAGNOSIS — R300 Dysuria: Secondary | ICD-10-CM | POA: Diagnosis not present

## 2020-01-08 DIAGNOSIS — N390 Urinary tract infection, site not specified: Secondary | ICD-10-CM | POA: Diagnosis not present

## 2020-01-08 DIAGNOSIS — E118 Type 2 diabetes mellitus with unspecified complications: Secondary | ICD-10-CM | POA: Diagnosis not present

## 2020-01-08 DIAGNOSIS — J449 Chronic obstructive pulmonary disease, unspecified: Secondary | ICD-10-CM | POA: Diagnosis not present

## 2020-01-23 DIAGNOSIS — N390 Urinary tract infection, site not specified: Secondary | ICD-10-CM | POA: Diagnosis not present

## 2020-01-23 DIAGNOSIS — R8281 Pyuria: Secondary | ICD-10-CM | POA: Diagnosis not present

## 2020-01-28 ENCOUNTER — Ambulatory Visit: Payer: Medicare Other | Admitting: Pulmonary Disease

## 2020-02-01 ENCOUNTER — Encounter: Payer: Self-pay | Admitting: Adult Health

## 2020-02-01 ENCOUNTER — Other Ambulatory Visit: Payer: Self-pay

## 2020-02-01 ENCOUNTER — Ambulatory Visit: Payer: Medicare HMO | Admitting: Adult Health

## 2020-02-01 DIAGNOSIS — J449 Chronic obstructive pulmonary disease, unspecified: Secondary | ICD-10-CM

## 2020-02-01 NOTE — Patient Instructions (Addendum)
Continue on Trelegy 1 puff daily.,  Rinse after use Activity as tolerated. Albuterol as needed. Follow-up in 1 year with Dr. Vaughan Browner and as needed

## 2020-02-01 NOTE — Progress Notes (Signed)
@Patient  ID: Sherri King, female    DOB: 03-08-1940, 80 y.o.   MRN: [id number]  Chief Complaint  Patient presents with  . Follow-up  . COPD    Referring provider: Deland Pretty, MD  HPI: 80 year old female former smoker followed for COPD  TEST/EVENTS :  PFTs 05/07/15 FVC 1.98 [70%), FEV1 1.23 (50%), post-BD FEV1 1.47 [70%], +19% change, F/F 62, TLC 104%, RV/TLC 120%, DLCO 62%. Moderate-severe obstruction, positive bronchodilator response. Moderate reduction in diffusion capacity.  PFTs 12/07/16 FVC 1.89 [24%), FEV1 1.18 [62%), TLC 105%, DLCO 63% Moderate obstruction with moderate diffusion defect  Chest x-ray 10/26/16- chronic bronchitis, hyperinflation, atelectasis.  I have reviewed the images personally  CBC 07/10/15- absolute eosinophilic count 254 CBC 27/0/62-BJSEGBTD eosinophil count 315  CT chest August 27, 2019 lung RADS 2 benign appearance  02/01/2020 Follow up : COPD  Patient returns for a follow-up visit.  She was last seen in 2019.  Patient has underlying moderate  COPD.  She remains on Trelegy daily.  She participates in the low-dose CT screening program.  Last CT was August 27, 2019 that showed a lung RADS 2 benign appearance.  With emphysematous changes.  Overall patient says she is doing well with no flare of cough or wheezing.  Has good and bad days. Mostly does well.  Remains independent , lives at home. Drives, does housework and yard work. Very active.   Covid vaccine x 3   Allergies  Allergen Reactions  . Bee Venom Diarrhea, Nausea And Vomiting and Swelling    Immunization History  Administered Date(s) Administered  . Fluad Quad(high Dose 65+) 09/26/2018  . Influenza Split 09/03/2014, 09/07/2016  . PFIZER(Purple Top)SARS-COV-2 Vaccination 03/10/2019, 03/31/2019, 12/03/2019  . Zoster Recombinat (Shingrix) 12/21/2017, 05/09/2018    Past Medical History:  Diagnosis Date  . Anxiety   . Arthritis   . Cancer ([place])    cervical  . COPD  (chronic obstructive pulmonary disease) ([place])   . Coronary vasospasm ([place])   . Depression   . GERD (gastroesophageal reflux disease)   . Hyperlipidemia   . Hypertension   . Multiple thyroid nodules     Tobacco History: Social History   Tobacco Use  Smoking Status Former Smoker  . Packs/day: 1.00  . Years: 60.00  . Pack years: 60.00  . Types: Cigarettes  . Quit date: 04/28/2014  . Years since quitting: 5.7  Smokeless Tobacco Never Used   Counseling given: Not Answered   Outpatient Medications Prior to Visit  Medication Sig Dispense Refill  . acetaminophen (TYLENOL) 500 MG tablet Take 500 mg by mouth every 6 (six) hours as needed for mild pain.     Marland Kitchen aspirin EC 81 MG tablet Take 81 mg by mouth daily.    . carboxymethylcellulose (REFRESH PLUS) 0.5 % SOLN Place 1 drop into both eyes 2 (two) times daily as needed (dryness).     . cholecalciferol (VITAMIN D) 1000 units tablet Take 1,000 Units by mouth daily.    . Multiple Vitamins-Minerals (MULTIVITAMIN WITH MINERALS) tablet Take 1 tablet by mouth daily.    . nitroGLYCERIN (NITROSTAT) 0.4 MG SL tablet Place 1 tablet (0.4 mg total) under the tongue every 5 (five) minutes as needed for chest pain. 25 tablet 3  . Omega-3 1000 MG CAPS Take 1 capsule by mouth daily.    . rosuvastatin (CRESTOR) 5 MG tablet Take 5 mg by mouth daily.    . sertraline (ZOLOFT) 50 MG tablet Take 50 mg by mouth daily as  needed (anxiety).   4  . traMADol (ULTRAM) 50 MG tablet Take 50-100 mg by mouth every 12 (twelve) hours as needed for moderate pain.   0  . TRELEGY ELLIPTA 100-62.5-25 MCG/INH AEPB Inhale 1 puff into the lungs daily.     Marland Kitchen omeprazole (PRILOSEC) 20 MG capsule Take 1 capsule (20 mg total) by mouth daily for 14 days. 14 capsule 0   No facility-administered medications prior to visit.     Review of Systems:   Constitutional:   No  weight loss, night sweats,  Fevers, chills, fatigue, or  lassitude.  HEENT:   No headaches,  Difficulty  swallowing,  Tooth/dental problems, or  Sore throat,                No sneezing, itching, ear ache, nasal congestion, post nasal drip,   CV:  No chest pain,  Orthopnea, PND, swelling in lower extremities, anasarca, dizziness, palpitations, syncope.   GI  No heartburn, indigestion, abdominal pain, nausea, vomiting, diarrhea, change in bowel habits, loss of appetite, bloody stools.   Resp:    No excess mucus, no productive cough,  No non-productive cough,  No coughing up of blood.  No change in color of mucus.  No wheezing.  No chest wall deformity  Skin: no rash or lesions.  GU: no dysuria, change in color of urine, no urgency or frequency.  No flank pain, no hematuria   MS:  No joint pain or swelling.  No decreased range of motion.  No back pain.    Physical Exam  BP 124/74 (BP Location: Left Arm, Patient Position: Sitting, Cuff Size: Normal)   Pulse 82   Temp 97.9 F (36.6 C) (Temporal)   Ht 5' 4.25" (1.632 m)   Wt 164 lb (74.4 kg)   SpO2 95%   BMI 27.93 kg/m   GEN: A/Ox3; pleasant , NAD, well nourished    HEENT:  [place]/AT,  , NOSE-clear, THROAT-clear, no lesions, no postnasal drip or exudate noted.   NECK:  Supple w/ fair ROM; no JVD; normal carotid impulses w/o bruits; no thyromegaly or nodules palpated; no lymphadenopathy.    RESP  Clear  P & A; w/o, wheezes/ rales/ or rhonchi. no accessory muscle use, no dullness to percussion  CARD:  RRR, no m/r/g, no peripheral edema, pulses intact, no cyanosis or clubbing.  GI:   Soft & nt; nml bowel sounds; no organomegaly or masses detected.   Musco: Warm bil, no deformities or joint swelling noted.   Neuro: alert, no focal deficits noted.    Skin: Warm, no lesions or rashes    Lab Results:  CBC   BNP No results found for: BNP  ProBNP No results found for: PROBNP  Imaging: No results found.    PFT Results Latest Ref Rng & Units 12/07/2016 05/07/2015  FVC-Pre L 1.85 1.98  FVC-Predicted Pre % 72 70  FVC-Post L  1.89 2.31  FVC-Predicted Post % 74 82  Pre FEV1/FVC % % 56 62  Post FEV1/FCV % % 63 64  FEV1-Pre L 1.04 1.23  FEV1-Predicted Pre % 54 58  FEV1-Post L 1.18 1.47  DLCO uncorrected ml/min/mmHg 13.59 14.80  DLCO UNC% % 63 62  DLCO corrected ml/min/mmHg 13.20 14.84  DLCO COR %Predicted % 61 62  DLVA Predicted % 76 79  TLC L 5.00 5.22  TLC % Predicted % 105 104  RV % Predicted % 135 135    No results found for: NITRICOXIDE  Assessment & Plan:   COPD GOLD II with reversibility  Doing very well on TRELEGY  Continue with yearly LDCT chest screening program   Plan  Patient Instructions  Continue on Trelegy 1 puff daily.,  Rinse after use Activity as tolerated. Albuterol as needed. Follow-up in 1 year with Dr. Vaughan Browner and as needed        Rexene Edison, NP 02/01/2020

## 2020-02-01 NOTE — Assessment & Plan Note (Addendum)
Doing very well on TRELEGY  Continue with yearly LDCT chest screening program   Plan  Patient Instructions  Continue on Trelegy 1 puff daily.,  Rinse after use Activity as tolerated. Albuterol as needed. Follow-up in 1 year with Dr. Vaughan Browner and as needed

## 2020-03-03 DIAGNOSIS — K219 Gastro-esophageal reflux disease without esophagitis: Secondary | ICD-10-CM | POA: Diagnosis not present

## 2020-03-03 DIAGNOSIS — E785 Hyperlipidemia, unspecified: Secondary | ICD-10-CM | POA: Diagnosis not present

## 2020-03-03 DIAGNOSIS — M179 Osteoarthritis of knee, unspecified: Secondary | ICD-10-CM | POA: Diagnosis not present

## 2020-03-03 DIAGNOSIS — M810 Age-related osteoporosis without current pathological fracture: Secondary | ICD-10-CM | POA: Diagnosis not present

## 2020-05-12 ENCOUNTER — Other Ambulatory Visit: Payer: Self-pay | Admitting: Internal Medicine

## 2020-05-12 DIAGNOSIS — Z1231 Encounter for screening mammogram for malignant neoplasm of breast: Secondary | ICD-10-CM

## 2020-06-09 NOTE — Progress Notes (Signed)
Sherri King Date of Birth: [birth year] Medical Record #[id number]  History of Present Illness: [name] is seen for follow up coronary vasospasm.   She has a history of  tobacco abuse, vasospastic CAD with last cath in 2009 showing an area of narrowing in the mid LAD that resolved with NTG. She was previously on nitrate therapy but this was stopped over 5 years ago. Other issues include HTN and HLD. When  seen in 2016 she had quit smoking and was off all cardiac meds. She is followed by pulmonary for COPD. She was last seen by Almyra Deforest PA-C in Feb. 2021 and was doing well.  On follow up today she notes she has some intermittent pain  in the central chest. This is fleeting and only lasts a few seconds. Always at rest. No clear triggers. She does complain of pain in both shins bilateral for 6 months. Typically also occurs at rest. She is active working in her yard and in the woods.    Current Outpatient Medications  Medication Sig Dispense Refill   acetaminophen (TYLENOL) 500 MG tablet Take 500 mg by mouth every 6 (six) hours as needed for mild pain.      aspirin EC 81 MG tablet Take 81 mg by mouth daily.     carboxymethylcellulose (REFRESH PLUS) 0.5 % SOLN Place 1 drop into both eyes 2 (two) times daily as needed (dryness).      cholecalciferol (VITAMIN D) 1000 units tablet Take 1,000 Units by mouth daily.     denosumab (PROLIA) 60 MG/ML SOSY injection 60 mg     metFORMIN (GLUCOPHAGE-XR) 750 MG 24 hr tablet SMARTSIG:1 Tablet(s) By Mouth Every Evening     Multiple Vitamins-Minerals (MULTIVITAMIN WITH MINERALS) tablet Take 1 tablet by mouth daily.     nitroGLYCERIN (NITROSTAT) 0.4 MG SL tablet Place 1 tablet (0.4 mg total) under the tongue every 5 (five) minutes as needed for chest pain. 25 tablet 3   Omega-3 1000 MG CAPS Take 1 capsule by mouth daily.     Potassium 99 MG TABS 1 tablet     rosuvastatin (CRESTOR) 5 MG tablet Take 5 mg by mouth daily.     sertraline (ZOLOFT) 50 MG  tablet Take 50 mg by mouth daily as needed (anxiety).   4   traMADol (ULTRAM) 50 MG tablet Take 50-100 mg by mouth every 12 (twelve) hours as needed for moderate pain.   0   TRELEGY ELLIPTA 100-62.5-25 MCG/INH AEPB Inhale 1 puff into the lungs daily.      omeprazole (PRILOSEC) 20 MG capsule Take 1 capsule (20 mg total) by mouth daily for 14 days. 14 capsule 0   No current facility-administered medications for this visit.    Allergies  Allergen Reactions   Bee Venom Diarrhea, Nausea And Vomiting and Swelling    Past Medical History:  Diagnosis Date   Anxiety    Arthritis    Cancer ([place])    cervical   COPD (chronic obstructive pulmonary disease) ([facility])    Coronary vasospasm ([facility])    Depression    GERD (gastroesophageal reflux disease)    Hyperlipidemia    Hypertension    Multiple thyroid nodules     Past Surgical History:  Procedure Laterality Date   ABDOMINAL HYSTERECTOMY     due to cancer   BREAST BIOPSY Left 08/21/2014   BREAST EXCISIONAL BIOPSY Left 10/2014   BREAST SURGERY Bilateral 2016   removal of several nodules from right breast  RADIOACTIVE SEED GUIDED EXCISIONAL BREAST BIOPSY Left 10/31/2014   Procedure: RADIOACTIVE SEED GUIDED EXCISIONAL LEFT BREAST BIOPSY;  Surgeon: Rolm Bookbinder, MD;  Location: [facility];  Service: General;  Laterality: Left;   THYROIDECTOMY, PARTIAL     TUBAL LIGATION     VESICOVAGINAL FISTULA CLOSURE W/ TAH      Social History   Tobacco Use  Smoking Status Former   Packs/day: 1.00   Years: 60.00   Pack years: 60.00   Types: Cigarettes   Quit date: 04/28/2014   Years since quitting: 6.1  Smokeless Tobacco Never    Social History   Substance and Sexual Activity  Alcohol Use Yes   Alcohol/week: 0.0 standard drinks   Comment: rarely a beer    Family History  Problem Relation Age of Onset   Heart failure Mother    Stroke Mother    Cancer Father        lung   Cancer Sister        lung, mets to brain    Breast cancer Sister    Cancer Sister        lung   Heart disease Sister    Allergies Sister        all 66 sisters   Cancer Other        bone    Review of Systems: The review of systems is per the HPI. All other systems were reviewed and are negative.  Physical Exam: BP (!) 148/82 (BP Location: Left Arm, Patient Position: Sitting, Cuff Size: Normal)   Pulse (!) 51   Ht 5\' 4"  (1.626 m)   Wt 164 lb 6.4 oz (74.6 kg)   BMI 28.22 kg/m  GENERAL:  Well appearing overweight WF in NAD HEENT:  PERRL, EOMI, sclera are clear. Oropharynx is clear. NECK:  No jugular venous distention, carotid upstroke brisk and symmetric, no bruits, no thyromegaly or adenopathy LUNGS:  Clear to auscultation bilaterally CHEST:  Unremarkable HEART:  RRR,  PMI not displaced or sustained,S1 and S2 within normal limits, no S3, no S4: no clicks, no rubs, no murmurs ABD:  Soft, nontender. BS +, no masses or bruits. No hepatomegaly, no splenomegaly EXT:  1+ pulses throughout, no edema, no cyanosis no clubbing SKIN:  Warm and dry.  No rashes NEURO:  Alert and oriented x 3. Cranial nerves II through XII intact. PSYCH:  Cognitively intact    Wt Readings from Last 3 Encounters:  06/23/20 164 lb 6.4 oz (74.6 kg)  02/01/20 164 lb (74.4 kg)  07/19/19 165 lb 12.8 oz (75.2 kg)     LABORATORY DATA:  Lab Results  Component Value Date   WBC 11.8 (H) 07/03/2019   HGB 13.3 07/03/2019   HCT 41.5 07/03/2019   PLT 135 (L) 07/03/2019   GLUCOSE 127 (H) 07/03/2019   ALT 22 07/01/2019   AST 26 07/01/2019   NA 139 07/03/2019   K 3.8 07/03/2019   CL 106 07/03/2019   CREATININE 0.68 07/03/2019   BUN 32 (H) 07/03/2019   CO2 25 07/03/2019   HGBA1C 6.1 (H) 09/26/2018    Dated 04/19/16: cholesterol 158, triglycerides 84, HDL 39, LDL 102. CMET normal. Dated 04/27/17: cholesterol 166, triglycerides 81, HDL 48, LDL 102. CBC and CMET normal.  Dated 05/08/19: Cholesterol 189, triglycerides 89, HDL 55, LDL 118.  Dated 08/29/19:  A1c 6.2%.  Ecg today shows NSR with normal Ecg. Rate 51. I have personally reviewed and interpreted this study.   Assessment / Plan:  1. Vasospastic CAD - having some atypical and transient symptoms.   sl Ntg prescription for her to use as needed. She will call if symptoms progress.   2. Tobacco abuse - now stopped.  3. HTN - controlled.   4. HLD - on Crestor. Per primary care  5. COPD per pulmonary.   6. Leg pain. Given smoking history we will check LE arterial dopplers.   See back in a year.

## 2020-06-23 ENCOUNTER — Other Ambulatory Visit: Payer: Self-pay

## 2020-06-23 ENCOUNTER — Ambulatory Visit: Payer: Medicare (Managed Care) | Admitting: Cardiology

## 2020-06-23 ENCOUNTER — Encounter: Payer: Self-pay | Admitting: Cardiology

## 2020-06-23 VITALS — BP 148/82 | HR 51 | Ht 64.0 in | Wt 164.4 lb

## 2020-06-23 DIAGNOSIS — M79604 Pain in right leg: Secondary | ICD-10-CM

## 2020-06-23 DIAGNOSIS — I10 Essential (primary) hypertension: Secondary | ICD-10-CM | POA: Diagnosis not present

## 2020-06-23 DIAGNOSIS — E7800 Pure hypercholesterolemia, unspecified: Secondary | ICD-10-CM | POA: Diagnosis not present

## 2020-06-23 DIAGNOSIS — M79605 Pain in left leg: Secondary | ICD-10-CM

## 2020-06-23 DIAGNOSIS — I201 Angina pectoris with documented spasm: Secondary | ICD-10-CM | POA: Diagnosis not present

## 2020-06-23 MED ORDER — NITROGLYCERIN 0.4 MG SL SUBL: 0.4000 mg | SUBLINGUAL_TABLET | SUBLINGUAL | 11 refills | Status: AC | PRN

## 2020-06-23 NOTE — Patient Instructions (Signed)
Medication Instructions:   Continue same medications  *If you need a refill on your cardiac medications before your next appointment, please call your pharmacy*   Lab Work:  None ordered   Testing/Procedures:  Schedule Lower Ext Arterial Dopplers   Follow-Up: At [facility], you and your health needs are our priority.  As part of our continuing mission to provide you with exceptional heart care, we have created designated Provider Care Teams.  These Care Teams include your primary Cardiologist (physician) and Advanced Practice Providers (APPs -  Physician Assistants and Nurse Practitioners) who all work together to provide you with the care you need, when you need it.  We recommend signing up for the patient portal called "MyChart".  Sign up information is provided on this After Visit Summary.  MyChart is used to connect with patients for Virtual Visits (Telemedicine).  Patients are able to view lab/test results, encounter notes, upcoming appointments, etc.  Non-urgent messages can be sent to your provider as well.   To learn more about what you can do with MyChart, go to [email or web address].    Your next appointment:  1 year   Call in March to schedule June appointment    The format for your next appointment:  Office   Provider:  Dr.Jordan

## 2020-07-08 ENCOUNTER — Other Ambulatory Visit: Payer: Self-pay

## 2020-07-08 ENCOUNTER — Ambulatory Visit
Admission: RE | Admit: 2020-07-08 | Discharge: 2020-07-08 | Disposition: A | Discharge status: Home / Self Care | Payer: Medicare HMO | Source: Ambulatory Visit | Attending: Internal Medicine | Admitting: Internal Medicine

## 2020-07-08 DIAGNOSIS — Z1231 Encounter for screening mammogram for malignant neoplasm of breast: Secondary | ICD-10-CM

## 2020-07-11 ENCOUNTER — Other Ambulatory Visit: Payer: Self-pay | Admitting: Cardiology

## 2020-07-11 DIAGNOSIS — M79605 Pain in left leg: Secondary | ICD-10-CM

## 2020-07-11 DIAGNOSIS — Z87891 Personal history of nicotine dependence: Secondary | ICD-10-CM

## 2020-07-11 DIAGNOSIS — I201 Angina pectoris with documented spasm: Secondary | ICD-10-CM

## 2020-07-24 ENCOUNTER — Ambulatory Visit (HOSPITAL_COMMUNITY)
Admission: RE | Admit: 2020-07-24 | Discharge: 2020-07-24 | Disposition: A | Discharge status: Home / Self Care | Payer: Medicare (Managed Care) | Source: Ambulatory Visit | Attending: Cardiology | Admitting: Cardiology

## 2020-07-24 ENCOUNTER — Other Ambulatory Visit: Payer: Self-pay

## 2020-07-24 DIAGNOSIS — M79604 Pain in right leg: Secondary | ICD-10-CM | POA: Diagnosis present

## 2020-07-24 DIAGNOSIS — Z87891 Personal history of nicotine dependence: Secondary | ICD-10-CM | POA: Insufficient documentation

## 2020-07-24 DIAGNOSIS — I201 Angina pectoris with documented spasm: Secondary | ICD-10-CM | POA: Insufficient documentation

## 2020-07-24 DIAGNOSIS — M79605 Pain in left leg: Secondary | ICD-10-CM | POA: Diagnosis not present

## 2021-01-30 ENCOUNTER — Ambulatory Visit: Payer: Medicare (Managed Care) | Admitting: Pulmonary Disease

## 2021-05-14 NOTE — Progress Notes (Signed)
? ? [name] D.Merril Abbe ?[facility] ?[place] ?Phone: [phone] ?  ?Assessment and Plan:   ?  ?1. Chronic bilateral low back pain without sciatica ?2. Muscle strain of right gluteal region, initial encounter ?3.  History of gastric ulcer ?-Chronic with exacerbation, subsequent visit ?- Patient has history of chronic back pain with old compression fracture at T6 and L1 as well as degenerative changes at L4-L5 and L5-S1.  However patient's pain is more consistent with gluteal muscle strain based on physical exam at today's visit.  We will proceed with conservative therapy at this time and reevaluate in 2 weeks ?- Start Tylenol 500 mg 3 times daily for day-to-day pain relief.  May use her prescription of tramadol as needed for breakthrough pain ?- Patient does not use NSAIDs due to history of GI ulcer.  Instructed patient okay to buy over-the-counter Voltaren gel to use topically twice a day ?- HEP for gluteal musculature provided at today's visit ? ? ?Pertinent previous records reviewed include CT L-spine 07/01/2019, x-ray lumbar spine 07/01/2019, CT thoracic spine 07/02/2019 ?  ?Follow Up: 2 weeks for reevaluation.  If no improvement or worsening of symptoms, would proceed with right hip x-ray with pelvis ?  ?Subjective:   ?I, Pincus Badder, am serving as a Education administrator for [employer] ? ?Chief Complaint: low back pain  ? ?HPI:  ?05/15/2021 ?Patient is a 81 year old female complaining of low back pain. Patient states that her rear end hurts it wrap around to the groin it doesn't hurt all the time , it hurts after doing a lot of activity , no  numbness or tingling , has been using tramadol helps she is able to relax, hasn't fallen in the last year, this pain has been going on for about 2-3 months  ? ?Relevant Historical Information: GI ulcer per patient, COPD ? ?Additional pertinent review of systems negative. ? ? ?Current Outpatient Medications:  ?   acetaminophen (TYLENOL) 500 MG tablet, Take 500 mg by mouth every 6 (six) hours as needed for mild pain. , Disp: , Rfl:  ?  aspirin EC 81 MG tablet, Take 81 mg by mouth daily., Disp: , Rfl:  ?  carboxymethylcellulose (REFRESH PLUS) 0.5 % SOLN, Place 1 drop into both eyes 2 (two) times daily as needed (dryness). , Disp: , Rfl:  ?  cholecalciferol (VITAMIN D) 1000 units tablet, Take 1,000 Units by mouth daily., Disp: , Rfl:  ?  denosumab (PROLIA) 60 MG/ML SOSY injection, 60 mg, Disp: , Rfl:  ?  metFORMIN (GLUCOPHAGE-XR) 750 MG 24 hr tablet, SMARTSIG:1 Tablet(s) By Mouth Every Evening, Disp: , Rfl:  ?  Multiple Vitamins-Minerals (MULTIVITAMIN WITH MINERALS) tablet, Take 1 tablet by mouth daily., Disp: , Rfl:  ?  nitroGLYCERIN (NITROSTAT) 0.4 MG SL tablet, Place 1 tablet (0.4 mg total) under the tongue every 5 (five) minutes as needed for chest pain., Disp: 25 tablet, Rfl: 11 ?  Omega-3 1000 MG CAPS, Take 1 capsule by mouth daily., Disp: , Rfl:  ?  Potassium 99 MG TABS, 1 tablet, Disp: , Rfl:  ?  rosuvastatin (CRESTOR) 5 MG tablet, Take 5 mg by mouth daily., Disp: , Rfl:  ?  sertraline (ZOLOFT) 50 MG tablet, Take 50 mg by mouth daily as needed (anxiety). , Disp: , Rfl: 4 ?  traMADol (ULTRAM) 50 MG tablet, Take 50-100 mg by mouth every 12 (twelve) hours as needed for moderate pain. , Disp: , Rfl:  0 ?  TRELEGY ELLIPTA 100-62.5-25 MCG/INH AEPB, Inhale 1 puff into the lungs daily. , Disp: , Rfl:  ?  omeprazole (PRILOSEC) 20 MG capsule, Take 1 capsule (20 mg total) by mouth daily for 14 days., Disp: 14 capsule, Rfl: 0  ? ?Objective:   ?  ?Vitals:  ? 05/15/21 1452  ?BP: 122/82  ?Pulse: 60  ?SpO2: 97%  ?Weight: 147 lb (66.7 kg)  ?Height: '5\' 4"'$  (1.626 m)  ?  ?  ?Body mass index is 25.23 kg/m?.  ?  ?Physical Exam:   ? ?General: awake, alert, and oriented no acute distress, nontoxic ?Skin: no suspicious lesions or rashes ?Neuro:sensation intact distally with no dificits, normal muscle tone, no atrophy, strength 5/5 in all tested  lower ext groups ?Psych: normal mood and affect, speech clear ? ?Right hip: ?No deformity, swelling or wasting ?ROM Flexion 90, ext 30, IR 45, ER 45 ?TTP gluteal musculature, greater trochanter ?NTTP over the hip flexors,  , si joint, lumbar spine ?Negative log roll with FROM ?Negative FABER ?Positive FADIR ?Positive piriformis test ?  ?Gait normal  ? ? ?Electronically signed by:  ?[name] D.Merril Abbe ?[facility] ?3:12 PM 05/15/21 ?

## 2021-05-15 ENCOUNTER — Ambulatory Visit: Payer: Medicare (Managed Care) | Admitting: Sports Medicine

## 2021-05-15 VITALS — BP 122/82 | HR 60 | Ht 64.0 in | Wt 147.0 lb

## 2021-05-15 DIAGNOSIS — G8929 Other chronic pain: Secondary | ICD-10-CM | POA: Diagnosis not present

## 2021-05-15 DIAGNOSIS — M545 Low back pain, unspecified: Secondary | ICD-10-CM

## 2021-05-15 DIAGNOSIS — Z8711 Personal history of peptic ulcer disease: Secondary | ICD-10-CM | POA: Diagnosis not present

## 2021-05-15 DIAGNOSIS — S76011A Strain of muscle, fascia and tendon of right hip, initial encounter: Secondary | ICD-10-CM | POA: Diagnosis not present

## 2021-05-15 NOTE — Patient Instructions (Addendum)
Good to see you  ?Glute HEP  ?Tylenol 500  mg 3 times a day for pain relief  ?Voltaren gel over painful areas ?2 week follow up  ? ? ?

## 2021-05-27 NOTE — Progress Notes (Unsigned)
    Sherri King D.[place] Phone: [phone]   Assessment and Plan:     There are no diagnoses linked to this encounter.  ***   Pertinent previous records reviewed include ***   Follow Up: ***     Subjective:   I, Sherri King, am serving as a Education administrator for Doctor Sherri King   Chief Complaint: low back pain    HPI:  05/15/2021 Patient is a 81 year old female complaining of low back pain. Patient states that her rear end hurts it wrap around to the groin it doesn't hurt all the time , it hurts after doing a lot of activity , no  numbness or tingling , has been using tramadol helps she is able to relax, hasn't fallen in the last year, this pain has been going on for about 2-3 months   05/29/2021 Patient states    Relevant Historical Information: GI ulcer per patient, COPD  Additional pertinent review of systems negative.   Current Outpatient Medications:    acetaminophen (TYLENOL) 500 MG tablet, Take 500 mg by mouth every 6 (six) hours as needed for mild pain. , Disp: , Rfl:    aspirin EC 81 MG tablet, Take 81 mg by mouth daily., Disp: , Rfl:    carboxymethylcellulose (REFRESH PLUS) 0.5 % SOLN, Place 1 drop into both eyes 2 (two) times daily as needed (dryness). , Disp: , Rfl:    cholecalciferol (VITAMIN D) 1000 units tablet, Take 1,000 Units by mouth daily., Disp: , Rfl:    denosumab (PROLIA) 60 MG/ML SOSY injection, 60 mg, Disp: , Rfl:    metFORMIN (GLUCOPHAGE-XR) 750 MG 24 hr tablet, SMARTSIG:1 Tablet(s) By Mouth Every Evening, Disp: , Rfl:    Multiple Vitamins-Minerals (MULTIVITAMIN WITH MINERALS) tablet, Take 1 tablet by mouth daily., Disp: , Rfl:    nitroGLYCERIN (NITROSTAT) 0.4 MG SL tablet, Place 1 tablet (0.4 mg total) under the tongue every 5 (five) minutes as needed for chest pain., Disp: 25 tablet, Rfl: 11   Omega-3 1000 MG CAPS, Take 1 capsule by mouth daily., Disp: , Rfl:     omeprazole (PRILOSEC) 20 MG capsule, Take 1 capsule (20 mg total) by mouth daily for 14 days., Disp: 14 capsule, Rfl: 0   Potassium 99 MG TABS, 1 tablet, Disp: , Rfl:    rosuvastatin (CRESTOR) 5 MG tablet, Take 5 mg by mouth daily., Disp: , Rfl:    sertraline (ZOLOFT) 50 MG tablet, Take 50 mg by mouth daily as needed (anxiety). , Disp: , Rfl: 4   traMADol (ULTRAM) 50 MG tablet, Take 50-100 mg by mouth every 12 (twelve) hours as needed for moderate pain. , Disp: , Rfl: 0   TRELEGY ELLIPTA 100-62.5-25 MCG/INH AEPB, Inhale 1 puff into the lungs daily. , Disp: , Rfl:    Objective:     There were no vitals filed for this visit.    There is no height or weight on file to calculate BMI.    Physical Exam:    ***   Electronically signed by:  Sherri King D.[facility] 7:58 AM 05/27/21

## 2021-05-29 ENCOUNTER — Ambulatory Visit: Payer: Medicare (Managed Care) | Admitting: Sports Medicine

## 2021-06-04 NOTE — Progress Notes (Deleted)
    Sherri King D.[place] Phone: [phone]   Assessment and Plan:     There are no diagnoses linked to this encounter.  ***   Pertinent previous records reviewed include ***   Follow Up: ***     Subjective:   I, Sherri King, am serving as a Education administrator for Sherri King   Chief Complaint: low back pain    HPI:  05/15/2021 Patient is a 81 year old female complaining of low back pain. Patient states that her rear end hurts it wrap around to the groin it doesn't hurt all the time , it hurts after doing a lot of activity , no  numbness or tingling , has been using tramadol helps she is able to relax, hasn't fallen in the last year, this pain has been going on for about 2-3 months   06/05/2021 Patient states    Relevant Historical Information: GI ulcer per patient, COPD  Additional pertinent review of systems negative.   Current Outpatient Medications:    acetaminophen (TYLENOL) 500 MG tablet, Take 500 mg by mouth every 6 (six) hours as needed for mild pain. , Disp: , Rfl:    aspirin EC 81 MG tablet, Take 81 mg by mouth daily., Disp: , Rfl:    carboxymethylcellulose (REFRESH PLUS) 0.5 % SOLN, Place 1 drop into both eyes 2 (two) times daily as needed (dryness). , Disp: , Rfl:    cholecalciferol (VITAMIN D) 1000 units tablet, Take 1,000 Units by mouth daily., Disp: , Rfl:    denosumab (PROLIA) 60 MG/ML SOSY injection, 60 mg, Disp: , Rfl:    metFORMIN (GLUCOPHAGE-XR) 750 MG 24 hr tablet, SMARTSIG:1 Tablet(s) By Mouth Every Evening, Disp: , Rfl:    Multiple Vitamins-Minerals (MULTIVITAMIN WITH MINERALS) tablet, Take 1 tablet by mouth daily., Disp: , Rfl:    nitroGLYCERIN (NITROSTAT) 0.4 MG SL tablet, Place 1 tablet (0.4 mg total) under the tongue every 5 (five) minutes as needed for chest pain., Disp: 25 tablet, Rfl: 11   Omega-3 1000 MG CAPS, Take 1 capsule by mouth daily., Disp: , Rfl:     omeprazole (PRILOSEC) 20 MG capsule, Take 1 capsule (20 mg total) by mouth daily for 14 days., Disp: 14 capsule, Rfl: 0   Potassium 99 MG TABS, 1 tablet, Disp: , Rfl:    rosuvastatin (CRESTOR) 5 MG tablet, Take 5 mg by mouth daily., Disp: , Rfl:    sertraline (ZOLOFT) 50 MG tablet, Take 50 mg by mouth daily as needed (anxiety). , Disp: , Rfl: 4   traMADol (ULTRAM) 50 MG tablet, Take 50-100 mg by mouth every 12 (twelve) hours as needed for moderate pain. , Disp: , Rfl: 0   TRELEGY ELLIPTA 100-62.5-25 MCG/INH AEPB, Inhale 1 puff into the lungs daily. , Disp: , Rfl:    Objective:     There were no vitals filed for this visit.    There is no height or weight on file to calculate BMI.    Physical Exam:    ***   Electronically signed by:  Sherri King D.[facility] 8:04 AM 06/04/21

## 2021-06-05 ENCOUNTER — Ambulatory Visit: Payer: Medicare (Managed Care) | Admitting: Sports Medicine

## 2021-08-24 ENCOUNTER — Other Ambulatory Visit: Payer: Self-pay | Admitting: Internal Medicine

## 2021-08-24 DIAGNOSIS — Z1231 Encounter for screening mammogram for malignant neoplasm of breast: Secondary | ICD-10-CM

## 2021-08-24 NOTE — Progress Notes (Unsigned)
    [name] D.[place] Phone: [phone]   Assessment and Plan:     There are no diagnoses linked to this encounter.  ***   Pertinent previous records reviewed include ***   Follow Up: ***     Subjective:   I, Harjit Douds, am serving as a Education administrator for Doctor Glennon Mac  Chief Complaint: knee and feet pain   HPI:   08/25/2021 Patient is a 81 year old female complaining of knee and feet pain. Patient states    Relevant Historical Information: ***  Additional pertinent review of systems negative.   Current Outpatient Medications:    acetaminophen (TYLENOL) 500 MG tablet, Take 500 mg by mouth every 6 (six) hours as needed for mild pain. , Disp: , Rfl:    aspirin EC 81 MG tablet, Take 81 mg by mouth daily., Disp: , Rfl:    carboxymethylcellulose (REFRESH PLUS) 0.5 % SOLN, Place 1 drop into both eyes 2 (two) times daily as needed (dryness). , Disp: , Rfl:    cholecalciferol (VITAMIN D) 1000 units tablet, Take 1,000 Units by mouth daily., Disp: , Rfl:    denosumab (PROLIA) 60 MG/ML SOSY injection, 60 mg, Disp: , Rfl:    metFORMIN (GLUCOPHAGE-XR) 750 MG 24 hr tablet, SMARTSIG:1 Tablet(s) By Mouth Every Evening, Disp: , Rfl:    Multiple Vitamins-Minerals (MULTIVITAMIN WITH MINERALS) tablet, Take 1 tablet by mouth daily., Disp: , Rfl:    nitroGLYCERIN (NITROSTAT) 0.4 MG SL tablet, Place 1 tablet (0.4 mg total) under the tongue every 5 (five) minutes as needed for chest pain., Disp: 25 tablet, Rfl: 11   Omega-3 1000 MG CAPS, Take 1 capsule by mouth daily., Disp: , Rfl:    omeprazole (PRILOSEC) 20 MG capsule, Take 1 capsule (20 mg total) by mouth daily for 14 days., Disp: 14 capsule, Rfl: 0   Potassium 99 MG TABS, 1 tablet, Disp: , Rfl:    rosuvastatin (CRESTOR) 5 MG tablet, Take 5 mg by mouth daily., Disp: , Rfl:    sertraline (ZOLOFT) 50 MG tablet, Take 50 mg by mouth daily as needed (anxiety).  , Disp: , Rfl: 4   traMADol (ULTRAM) 50 MG tablet, Take 50-100 mg by mouth every 12 (twelve) hours as needed for moderate pain. , Disp: , Rfl: 0   TRELEGY ELLIPTA 100-62.5-25 MCG/INH AEPB, Inhale 1 puff into the lungs daily. , Disp: , Rfl:    Objective:     There were no vitals filed for this visit.    There is no height or weight on file to calculate BMI.    Physical Exam:    ***   Electronically signed by:  [name] D.[facility] 8:15 AM 08/24/21

## 2021-08-25 ENCOUNTER — Ambulatory Visit: Payer: Medicare (Managed Care) | Admitting: Sports Medicine

## 2021-08-25 VITALS — BP 140/82 | HR 66 | Ht 64.0 in | Wt 147.0 lb

## 2021-08-25 DIAGNOSIS — G8929 Other chronic pain: Secondary | ICD-10-CM | POA: Diagnosis not present

## 2021-08-25 DIAGNOSIS — M25562 Pain in left knee: Secondary | ICD-10-CM

## 2021-08-25 DIAGNOSIS — M170 Bilateral primary osteoarthritis of knee: Secondary | ICD-10-CM

## 2021-08-25 DIAGNOSIS — M25561 Pain in right knee: Secondary | ICD-10-CM | POA: Diagnosis not present

## 2021-08-25 NOTE — Patient Instructions (Addendum)
Good to see you  As needed follow up for whenever pain returns

## 2021-09-15 ENCOUNTER — Ambulatory Visit
Admission: RE | Admit: 2021-09-15 | Discharge: 2021-09-15 | Disposition: A | Discharge status: Home / Self Care | Payer: Medicare (Managed Care) | Source: Ambulatory Visit | Attending: Internal Medicine | Admitting: Internal Medicine

## 2021-09-15 DIAGNOSIS — Z1231 Encounter for screening mammogram for malignant neoplasm of breast: Secondary | ICD-10-CM

## 2022-01-08 DIAGNOSIS — H353221 Exudative age-related macular degeneration, left eye, with active choroidal neovascularization: Secondary | ICD-10-CM | POA: Diagnosis not present

## 2022-01-18 ENCOUNTER — Emergency Department (HOSPITAL_COMMUNITY)
Admission: EM | Admit: 2022-01-18 | Discharge: 2022-01-18 | Disposition: A | Discharge status: Home / Self Care | Payer: Medicare Other | Attending: Emergency Medicine | Admitting: Emergency Medicine

## 2022-01-18 ENCOUNTER — Emergency Department (HOSPITAL_COMMUNITY): Payer: Medicare Other

## 2022-01-18 ENCOUNTER — Other Ambulatory Visit: Payer: Self-pay

## 2022-01-18 DIAGNOSIS — W010XXA Fall on same level from slipping, tripping and stumbling without subsequent striking against object, initial encounter: Secondary | ICD-10-CM | POA: Insufficient documentation

## 2022-01-18 DIAGNOSIS — Z7982 Long term (current) use of aspirin: Secondary | ICD-10-CM | POA: Diagnosis not present

## 2022-01-18 DIAGNOSIS — S42342A Displaced spiral fracture of shaft of humerus, left arm, initial encounter for closed fracture: Secondary | ICD-10-CM | POA: Diagnosis not present

## 2022-01-18 DIAGNOSIS — Y92009 Unspecified place in unspecified non-institutional (private) residence as the place of occurrence of the external cause: Secondary | ICD-10-CM | POA: Insufficient documentation

## 2022-01-18 DIAGNOSIS — R0902 Hypoxemia: Secondary | ICD-10-CM | POA: Diagnosis not present

## 2022-01-18 DIAGNOSIS — M79602 Pain in left arm: Secondary | ICD-10-CM | POA: Insufficient documentation

## 2022-01-18 DIAGNOSIS — Z743 Need for continuous supervision: Secondary | ICD-10-CM | POA: Diagnosis not present

## 2022-01-18 DIAGNOSIS — T1490XA Injury, unspecified, initial encounter: Secondary | ICD-10-CM | POA: Diagnosis not present

## 2022-01-18 DIAGNOSIS — S42352A Displaced comminuted fracture of shaft of humerus, left arm, initial encounter for closed fracture: Secondary | ICD-10-CM | POA: Diagnosis not present

## 2022-01-18 DIAGNOSIS — I499 Cardiac arrhythmia, unspecified: Secondary | ICD-10-CM | POA: Diagnosis not present

## 2022-01-18 DIAGNOSIS — R52 Pain, unspecified: Secondary | ICD-10-CM | POA: Diagnosis not present

## 2022-01-18 DIAGNOSIS — S42332A Displaced oblique fracture of shaft of humerus, left arm, initial encounter for closed fracture: Secondary | ICD-10-CM | POA: Diagnosis not present

## 2022-01-18 MED ORDER — OXYCODONE-ACETAMINOPHEN 5-325 MG PO TABS
2.0000 | ORAL_TABLET | Freq: Once | ORAL | Status: AC
  Administered 2022-01-18: 2 via ORAL
  Filled 2022-01-18: qty 2

## 2022-01-18 MED ORDER — OXYCODONE-ACETAMINOPHEN 5-325 MG PO TABS: 1.0000 | ORAL_TABLET | Freq: Four times a day (QID) | ORAL | 0 refills | Status: AC | PRN

## 2022-01-18 NOTE — Progress Notes (Signed)
Orthopedic Tech Progress Note Patient Details:  Sherri King 05-Nov-1940 [id number]  Ortho Devices Type of Ortho Device: Arm sling Ortho Device/Splint Location: LUE Ortho Device/Splint Interventions: Application   Post Interventions Patient Tolerated: Well  [name] [name] 01/18/2022, 6:35 PM

## 2022-01-18 NOTE — ED Provider Notes (Signed)
[place] DEPT Provider Note   CSN: [id number] Arrival date & time: 01/18/22  1712     History  No chief complaint on file.   Sherri King is a 82 y.o. female.  This is 82 year old female presents with left-sided arm pain which occurred after mechanical fall just prior to arrival.  Struck her head but did not lose any consciousness.  Does not take any blood thinners.  Denies any head or neck pain at this time.  Notes mid shaft humerus pain.  No distal numbness or tingling to her left hand.  No chest abdominal or lower back pain.  EMS was called and patient given 100 mcg of fentanyl and transported here.  She was also given a sling       Home Medications Prior to Admission medications   Medication Sig Start Date End Date Taking? Authorizing Provider  acetaminophen (TYLENOL) 500 MG tablet Take 500 mg by mouth every 6 (six) hours as needed for mild pain.     [provider]  aspirin EC 81 MG tablet Take 81 mg by mouth daily.    [provider]  carboxymethylcellulose (REFRESH PLUS) 0.5 % SOLN Place 1 drop into both eyes 2 (two) times daily as needed (dryness).     [provider]  cholecalciferol (VITAMIN D) 1000 units tablet Take 1,000 Units by mouth daily.    [provider]  denosumab (PROLIA) 60 MG/ML SOSY injection 60 mg    [provider]  metFORMIN (GLUCOPHAGE-XR) 750 MG 24 hr tablet SMARTSIG:1 Tablet(s) By Mouth Every Evening 04/03/20   [provider]  Multiple Vitamins-Minerals (MULTIVITAMIN WITH MINERALS) tablet Take 1 tablet by mouth daily.    [provider]  nitroGLYCERIN (NITROSTAT) 0.4 MG SL tablet Place 1 tablet (0.4 mg total) under the tongue every 5 (five) minutes as needed for chest pain. 06/23/20   [place], Peter M, MD  Omega-3 1000 MG CAPS Take 1 capsule by mouth daily.    [provider]  omeprazole (PRILOSEC) 20 MG capsule Take 1 capsule (20 mg total) by  mouth daily for 14 days. 04/04/18 07/02/19  McDonald, Mia A, PA-C  Potassium 99 MG TABS 1 tablet    [provider]  rosuvastatin (CRESTOR) 5 MG tablet Take 5 mg by mouth daily. 06/19/19   [provider]  sertraline (ZOLOFT) 50 MG tablet Take 50 mg by mouth daily as needed (anxiety).  07/29/17   [provider]  traMADol (ULTRAM) 50 MG tablet Take 50-100 mg by mouth every 12 (twelve) hours as needed for moderate pain.  08/30/17   [provider]  TRELEGY ELLIPTA 100-62.5-25 MCG/INH AEPB Inhale 1 puff into the lungs daily.  07/19/18   [provider]      Allergies    Bee venom    Review of Systems   Review of Systems  All other systems reviewed and are negative.   Physical Exam Updated Vital Signs BP (!) 142/108   Pulse (!) 48   SpO2 97%  Physical Exam Vitals and nursing note reviewed.  Constitutional:      General: She is not in acute distress.    Appearance: Normal appearance. She is well-developed. She is not toxic-appearing.  HENT:     Head: Normocephalic and atraumatic.  Eyes:     General: Lids are normal.     Conjunctiva/sclera: Conjunctivae normal.     Pupils: Pupils are equal, round, and reactive to light.  Neck:  Thyroid: No thyroid mass.     Trachea: No tracheal deviation.  Cardiovascular:     Rate and Rhythm: Normal rate and regular rhythm.     Heart sounds: Normal heart sounds. No murmur heard.    No gallop.  Pulmonary:     Effort: Pulmonary effort is normal. No respiratory distress.     Breath sounds: Normal breath sounds. No stridor. No decreased breath sounds, wheezing, rhonchi or rales.  Abdominal:     General: There is no distension.     Palpations: Abdomen is soft.     Tenderness: There is no abdominal tenderness. There is no rebound.  Musculoskeletal:        General: No tenderness. Normal range of motion.       Arms:     Cervical back: Normal range of motion and neck supple.     Comments: Neurovascular  intact at left hand.  Skin intact at left upper extremity.  Skin:    General: Skin is warm and dry.     Findings: No abrasion or rash.  Neurological:     Mental Status: She is alert and oriented to person, place, and time. Mental status is at baseline.     GCS: GCS eye subscore is 4. GCS verbal subscore is 5. GCS motor subscore is 6.     Cranial Nerves: No cranial nerve deficit.     Sensory: No sensory deficit.     Motor: Motor function is intact.  Psychiatric:        Attention and Perception: Attention normal.        Speech: Speech normal.        Behavior: Behavior normal.     ED Results / Procedures / Treatments   Labs (all labs ordered are listed, but only abnormal results are displayed) Labs Reviewed - No data to display  EKG None  Radiology No results found.  Procedures Procedures    Medications Ordered in ED Medications - No data to display  ED Course/ Medical Decision Making/ A&P                             Medical Decision Making Amount and/or Complexity of Data Reviewed Radiology: ordered.  Risk Prescription drug management.   Patient medicated for pain with Percocet here.  Patient's x-ray of her left humerus consistent with fracture.  Placed into a sling for comfort by nursing.  Neurovascular status checked afterwards and remains intact at the left hand.  Discussed with on-call orthopedic surgeon, Dr. Erlinda Hong, and he will see her in the office.  Family is at bedside and they are agreeable to this.  Given copy of x-ray        Final Clinical Impression(s) / ED Diagnoses Final diagnoses:  None    Rx / DC Orders ED Discharge Orders     None         Lacretia Leigh, MD 01/18/22 1906

## 2022-01-18 NOTE — ED Triage Notes (Signed)
Pt BIB EMS due to a fall, she tripped over a cord in her home. Denies any LOC, did not hit her head. Pain in L upper arm. No other pain noted. Denies taking blood thinners.  EMS placed arm in sling.  BP 184/100 HR 68 RR 16 O2 97 RM  20G R FA 183mg fentanyl

## 2022-01-26 DIAGNOSIS — M84322A Stress fracture, left humerus, initial encounter for fracture: Secondary | ICD-10-CM | POA: Diagnosis not present

## 2022-01-26 DIAGNOSIS — S42302A Unspecified fracture of shaft of humerus, left arm, initial encounter for closed fracture: Secondary | ICD-10-CM | POA: Diagnosis not present

## 2022-01-28 ENCOUNTER — Ambulatory Visit: Payer: Medicare Other | Admitting: Orthopaedic Surgery

## 2022-02-05 DIAGNOSIS — H353221 Exudative age-related macular degeneration, left eye, with active choroidal neovascularization: Secondary | ICD-10-CM | POA: Diagnosis not present

## 2022-02-09 DIAGNOSIS — S42302A Unspecified fracture of shaft of humerus, left arm, initial encounter for closed fracture: Secondary | ICD-10-CM | POA: Diagnosis not present

## 2022-02-25 DIAGNOSIS — S42302D Unspecified fracture of shaft of humerus, left arm, subsequent encounter for fracture with routine healing: Secondary | ICD-10-CM | POA: Diagnosis not present

## 2022-03-05 DIAGNOSIS — H353221 Exudative age-related macular degeneration, left eye, with active choroidal neovascularization: Secondary | ICD-10-CM | POA: Diagnosis not present

## 2022-04-16 DIAGNOSIS — H353221 Exudative age-related macular degeneration, left eye, with active choroidal neovascularization: Secondary | ICD-10-CM | POA: Diagnosis not present

## 2022-04-27 DIAGNOSIS — S42302D Unspecified fracture of shaft of humerus, left arm, subsequent encounter for fracture with routine healing: Secondary | ICD-10-CM | POA: Diagnosis not present

## 2022-05-14 DIAGNOSIS — H353221 Exudative age-related macular degeneration, left eye, with active choroidal neovascularization: Secondary | ICD-10-CM | POA: Diagnosis not present

## 2022-06-11 DIAGNOSIS — H353221 Exudative age-related macular degeneration, left eye, with active choroidal neovascularization: Secondary | ICD-10-CM | POA: Diagnosis not present

## 2022-06-29 DIAGNOSIS — R7303 Prediabetes: Secondary | ICD-10-CM | POA: Diagnosis not present

## 2022-06-29 DIAGNOSIS — M810 Age-related osteoporosis without current pathological fracture: Secondary | ICD-10-CM | POA: Diagnosis not present

## 2022-06-29 DIAGNOSIS — E041 Nontoxic single thyroid nodule: Secondary | ICD-10-CM | POA: Diagnosis not present

## 2022-06-29 DIAGNOSIS — E785 Hyperlipidemia, unspecified: Secondary | ICD-10-CM | POA: Diagnosis not present

## 2022-07-04 ENCOUNTER — Encounter (HOSPITAL_COMMUNITY): Payer: Self-pay | Admitting: Emergency Medicine

## 2022-07-04 ENCOUNTER — Other Ambulatory Visit: Payer: Self-pay

## 2022-07-04 ENCOUNTER — Emergency Department (HOSPITAL_COMMUNITY)
Admission: EM | Admit: 2022-07-04 | Discharge: 2022-07-04 | Disposition: A | Discharge status: Home / Self Care | Payer: Medicare Other | Attending: Emergency Medicine | Admitting: Emergency Medicine

## 2022-07-04 ENCOUNTER — Emergency Department (HOSPITAL_COMMUNITY): Payer: Medicare Other

## 2022-07-04 DIAGNOSIS — R1013 Epigastric pain: Secondary | ICD-10-CM | POA: Diagnosis not present

## 2022-07-04 DIAGNOSIS — S2231XA Fracture of one rib, right side, initial encounter for closed fracture: Secondary | ICD-10-CM

## 2022-07-04 DIAGNOSIS — R1011 Right upper quadrant pain: Secondary | ICD-10-CM | POA: Diagnosis not present

## 2022-07-04 DIAGNOSIS — W010XXA Fall on same level from slipping, tripping and stumbling without subsequent striking against object, initial encounter: Secondary | ICD-10-CM | POA: Insufficient documentation

## 2022-07-04 DIAGNOSIS — Z79899 Other long term (current) drug therapy: Secondary | ICD-10-CM | POA: Insufficient documentation

## 2022-07-04 DIAGNOSIS — Z8541 Personal history of malignant neoplasm of cervix uteri: Secondary | ICD-10-CM | POA: Diagnosis not present

## 2022-07-04 DIAGNOSIS — J449 Chronic obstructive pulmonary disease, unspecified: Secondary | ICD-10-CM | POA: Diagnosis not present

## 2022-07-04 DIAGNOSIS — I10 Essential (primary) hypertension: Secondary | ICD-10-CM | POA: Insufficient documentation

## 2022-07-04 DIAGNOSIS — W19XXXA Unspecified fall, initial encounter: Secondary | ICD-10-CM

## 2022-07-04 DIAGNOSIS — S8001XA Contusion of right knee, initial encounter: Secondary | ICD-10-CM | POA: Diagnosis not present

## 2022-07-04 DIAGNOSIS — J90 Pleural effusion, not elsewhere classified: Secondary | ICD-10-CM | POA: Diagnosis not present

## 2022-07-04 DIAGNOSIS — Z7982 Long term (current) use of aspirin: Secondary | ICD-10-CM | POA: Diagnosis not present

## 2022-07-04 DIAGNOSIS — N281 Cyst of kidney, acquired: Secondary | ICD-10-CM | POA: Diagnosis not present

## 2022-07-04 DIAGNOSIS — Y9301 Activity, walking, marching and hiking: Secondary | ICD-10-CM | POA: Diagnosis not present

## 2022-07-04 DIAGNOSIS — K5730 Diverticulosis of large intestine without perforation or abscess without bleeding: Secondary | ICD-10-CM | POA: Diagnosis not present

## 2022-07-04 DIAGNOSIS — R0781 Pleurodynia: Secondary | ICD-10-CM | POA: Diagnosis present

## 2022-07-04 LAB — CBC
HCT: 41.6 % (ref 36.0–46.0)
Hemoglobin: 13.1 g/dL (ref 12.0–15.0)
MCH: 30.4 pg (ref 26.0–34.0)
MCHC: 31.5 g/dL (ref 30.0–36.0)
MCV: 96.5 fL (ref 80.0–100.0)
Platelets: 189 10*3/uL (ref 150–400)
RBC: 4.31 MIL/uL (ref 3.87–5.11)
RDW: 14.2 % (ref 11.5–15.5)
WBC: 10.4 10*3/uL (ref 4.0–10.5)
nRBC: 0 % (ref 0.0–0.2)

## 2022-07-04 LAB — BASIC METABOLIC PANEL
Anion gap: 9 (ref 5–15)
BUN: 18 mg/dL (ref 8–23)
CO2: 21 mmol/L — ABNORMAL LOW (ref 22–32)
Calcium: 8.4 mg/dL — ABNORMAL LOW (ref 8.9–10.3)
Chloride: 112 mmol/L — ABNORMAL HIGH (ref 98–111)
Creatinine, Ser: 0.63 mg/dL (ref 0.44–1.00)
GFR, Estimated: >60 mL/min (ref >60)
Glucose, Bld: 144 mg/dL — ABNORMAL HIGH (ref 70–99)
Potassium: 3.8 mmol/L (ref 3.5–5.1)
Sodium: 142 mmol/L (ref 135–145)

## 2022-07-04 LAB — PROTIME-INR
INR: 1 (ref 0.8–1.2)
Prothrombin Time: 13.1 seconds (ref 11.4–15.2)

## 2022-07-04 MED ORDER — IOHEXOL 350 MG/ML SOLN
75.0000 mL | Freq: Once | INTRAVENOUS | Status: AC | PRN
  Administered 2022-07-04: 75 mL via INTRAVENOUS

## 2022-07-04 MED ORDER — OXYCODONE HCL 5 MG PO TABS: 2.5000 mg | ORAL_TABLET | Freq: Four times a day (QID) | ORAL | 0 refills | Status: AC | PRN

## 2022-07-04 MED ORDER — HYDROMORPHONE HCL 1 MG/ML IJ SOLN
0.5000 mg | Freq: Once | INTRAMUSCULAR | Status: AC
  Administered 2022-07-04: 0.5 mg via INTRAVENOUS
  Filled 2022-07-04: qty 1

## 2022-07-04 MED ORDER — SODIUM CHLORIDE 0.9 % IV BOLUS
1000.0000 mL | Freq: Once | INTRAVENOUS | Status: AC
  Administered 2022-07-04: 1000 mL via INTRAVENOUS

## 2022-07-04 MED ORDER — FENTANYL CITRATE PF 50 MCG/ML IJ SOSY
50.0000 ug | PREFILLED_SYRINGE | Freq: Once | INTRAMUSCULAR | Status: AC
  Administered 2022-07-04: 50 ug via INTRAVENOUS
  Filled 2022-07-04: qty 1

## 2022-07-04 MED ORDER — OXYCODONE HCL 5 MG PO TABS
5.0000 mg | ORAL_TABLET | Freq: Once | ORAL | Status: AC
  Administered 2022-07-04: 5 mg via ORAL
  Filled 2022-07-04: qty 1

## 2022-07-04 NOTE — ED Provider Notes (Signed)
[place] EMERGENCY DEPARTMENT AT [facility] Provider Note  CSN: [id number] Arrival date & time: 07/04/22 0121  Chief Complaint(s) Fall  HPI Sherri King is a 82 y.o. female with a past medical history listed below who presents to the emergency department with right-sided rib pain following a mechanical fall 5 days ago.  Patient reports that she was walking outside and slipped on gravel causing her to fall backwards onto her right side.  Patient began having pain at that time but reports that is mild to moderate.  Has been controlling it with Tylenol since then but over the last few days, the pain has increased.  Worse with palpation and certain movements.  Tylenol has not alleviated the pain anymore.  She denies any anticoagulation.  No head trauma or loss of consciousness.  She denies any headache, neck pain, back pain, lower abdominal pain.  No hip pain.  She does report worsening of chronic knee pain since the fall.  No other extremity pain.  The history is provided by the patient.    Past Medical History Past Medical History:  Diagnosis Date   Anxiety    Arthritis    Cancer (HCC)    cervical   COPD (chronic obstructive pulmonary disease) (HCC)    Coronary vasospasm (HCC)    Depression    GERD (gastroesophageal reflux disease)    Hyperlipidemia    Hypertension    Multiple thyroid nodules    Patient Active Problem List   Diagnosis Date Noted   Low back pain at multiple sites 07/02/2019   Acute low back pain 07/02/2019   COPD GOLD II with reversibility  10/26/2016   Papilloma of breast 10/08/2014   HLD (hyperlipidemia) 05/04/2013   Atypical chest pain 06/07/2011   Coronary vasospasm (HCC) 06/07/2011   Hypertension 06/07/2011   Tobacco abuse 06/07/2011   Home Medication(s) Prior to Admission medications   Medication Sig Start Date End Date Taking? Authorizing Provider  oxyCODONE (ROXICODONE) 5 MG immediate release tablet Take 0.5-1 tablets (2.5-5 mg  total) by mouth every 6 (six) hours as needed for up to 5 days for severe pain. 07/04/22 07/09/22 Yes Philomena Buttermore, Amadeo Garnet, MD  acetaminophen (TYLENOL) 500 MG tablet Take 500 mg by mouth every 6 (six) hours as needed for mild pain.     [provider]  aspirin EC 81 MG tablet Take 81 mg by mouth daily.    [provider]  carboxymethylcellulose (REFRESH PLUS) 0.5 % SOLN Place 1 drop into both eyes 2 (two) times daily as needed (dryness).     [provider]  cholecalciferol (VITAMIN D) 1000 units tablet Take 1,000 Units by mouth daily.    [provider]  denosumab (PROLIA) 60 MG/ML SOSY injection 60 mg    [provider]  metFORMIN (GLUCOPHAGE-XR) 750 MG 24 hr tablet SMARTSIG:1 Tablet(s) By Mouth Every Evening 04/03/20   [provider]  Multiple Vitamins-Minerals (MULTIVITAMIN WITH MINERALS) tablet Take 1 tablet by mouth daily.    [provider]  nitroGLYCERIN (NITROSTAT) 0.4 MG SL tablet Place 1 tablet (0.4 mg total) under the tongue every 5 (five) minutes as needed for chest pain. 06/23/20   [place], Peter M, MD  Omega-3 1000 MG CAPS Take 1 capsule by mouth daily.    [provider]  omeprazole (PRILOSEC) 20 MG capsule Take 1 capsule (20 mg total) by mouth daily for 14 days. 04/04/18 07/02/19  McDonald, Mia A, PA-C  oxyCODONE-acetaminophen (PERCOCET/ROXICET) 5-325 MG tablet Take  1 tablet by mouth every 6 (six) hours as needed for severe pain. 01/18/22   Lorre Nick, MD  Potassium 99 MG TABS 1 tablet    [provider]  rosuvastatin (CRESTOR) 5 MG tablet Take 5 mg by mouth daily. 06/19/19   [provider]  sertraline (ZOLOFT) 50 MG tablet Take 50 mg by mouth daily as needed (anxiety).  07/29/17   [provider]  traMADol (ULTRAM) 50 MG tablet Take 50-100 mg by mouth every 12 (twelve) hours as needed for moderate pain.  08/30/17   [provider]  TRELEGY ELLIPTA 100-62.5-25 MCG/INH AEPB Inhale 1  puff into the lungs daily.  07/19/18   [provider]                                                                                                                                    Allergies Bee venom  Review of Systems Review of Systems As noted in HPI  Physical Exam Vital Signs  I have reviewed the triage vital signs BP (!) 171/98 (BP Location: Right Arm)   Pulse 66   Temp 98.7 F (37.1 C) (Oral)   Resp (!) 24   Ht 5\' 4"  (1.626 m)   Wt 65.8 kg   SpO2 92%   BMI 24.89 kg/m   Physical Exam Constitutional:      General: She is not in acute distress.    Appearance: She is well-developed. She is not diaphoretic.  HENT:     Head: Normocephalic and atraumatic.     Right Ear: External ear normal.     Left Ear: External ear normal.     Nose: Nose normal.  Eyes:     General: No scleral icterus.       Right eye: No discharge.        Left eye: No discharge.     Conjunctiva/sclera: Conjunctivae normal.     Pupils: Pupils are equal, round, and reactive to light.  Cardiovascular:     Rate and Rhythm: Normal rate and regular rhythm.     Pulses:          Radial pulses are 2+ on the right side and 2+ on the left side.       Dorsalis pedis pulses are 2+ on the right side and 2+ on the left side.     Heart sounds: Normal heart sounds. No murmur heard.    No friction rub. No gallop.  Pulmonary:     Effort: Pulmonary effort is normal. No respiratory distress.     Breath sounds: Normal breath sounds. No stridor. No wheezing.  Chest:     Chest wall: Tenderness present.    Abdominal:     General: There is no distension.     Palpations: Abdomen is soft.     Tenderness: There is abdominal tenderness in the right upper quadrant and epigastric area. There is guarding.  Musculoskeletal:  Cervical back: Normal range of motion and neck supple. No bony tenderness. No spinous process tenderness or muscular tenderness.     Thoracic back: No bony tenderness.     Lumbar back:  No bony tenderness.     Right knee: No bony tenderness. Tenderness (over contusion only) present.       Legs:     Comments: Clavicles stable. Chest stable to AP/Lat compression. Pelvis stable to Lat compression. No obvious extremity deformity. No chest or abdominal wall contusion.  Skin:    General: Skin is warm and dry.     Findings: No erythema or rash.  Neurological:     Mental Status: She is alert and oriented to person, place, and time.     Comments: Moving all extremities     ED Results and Treatments Labs (all labs ordered are listed, but only abnormal results are displayed) Labs Reviewed  BASIC METABOLIC PANEL - Abnormal; Notable for the following components:      Result Value   Chloride 112 (*)    CO2 21 (*)    Glucose, Bld 144 (*)    Calcium 8.4 (*)    All other components within normal limits  CBC  PROTIME-INR                                                                                                                         EKG  EKG Interpretation Date/Time:    Ventricular Rate:    PR Interval:    QRS Duration:    QT Interval:    QTC Calculation:   R Axis:      Text Interpretation:         Radiology CT CHEST ABDOMEN PELVIS W CONTRAST  Result Date: 07/04/2022 CLINICAL DATA:  Polytrauma, blunt EXAM: CT CHEST, ABDOMEN, AND PELVIS WITH CONTRAST TECHNIQUE: Multidetector CT imaging of the chest, abdomen and pelvis was performed following the standard protocol during bolus administration of intravenous contrast. RADIATION DOSE REDUCTION: This exam was performed according to the departmental dose-optimization program which includes automated exposure control, adjustment of the mA and/or kV according to patient size and/or use of iterative reconstruction technique. CONTRAST:  75mL OMNIPAQUE IOHEXOL 350 MG/ML SOLN COMPARISON:  CT chest 08/27/2019, CT abdomen pelvis 07/01/2019 FINDINGS: CT CHEST FINDINGS Cardiovascular: No significant coronary artery  calcification. Global cardiac size within normal limits. No pericardial effusion. Central pulmonary arteries are of normal caliber. Mild atherosclerotic calcification within the thoracic aorta. No aortic aneurysm. Mediastinum/Nodes: Visualized thyroid is unremarkable. No pathologic thoracic adenopathy. Esophagus is unremarkable. Small hiatal hernia. Lungs/Pleura: Mild emphysema. Platelike scarring within the right upper lobe along the minor fissure. Mild right basilar atelectasis. Trace right pleural effusion. No pneumothorax. No confluent pulmonary infiltrate. Musculoskeletal: Stable T12 anterior wedge compression deformity. Osseous structures are diffusely osteopenic. Acute right fifth rib fracture noted anteriorly. No lytic or blastic bone lesion. CT ABDOMEN PELVIS FINDINGS Hepatobiliary: Mild hepatic steatosis. No enhancing intrahepatic mass. No intra or extrahepatic biliary ductal dilation. Gallbladder  unremarkable. Pancreas: Unremarkable. No pancreatic ductal dilatation or surrounding inflammatory changes. Spleen: Normal in size without focal abnormality. Adrenals/Urinary Tract: The adrenal glands are unremarkable. The kidneys are normal in size and position. Mild malrotation of the right kidney. Simple cortical cyst noted within the interpolar region of the left kidney. No hydronephrosis. No intrarenal or ureteral calculi. No perinephric inflammatory stranding. The bladder is unremarkable. Stomach/Bowel: Mild sigmoid and descending colonic diverticulosis without superimposed acute inflammatory change. The stomach, small bowel, and large bowel are otherwise unremarkable. No free intraperitoneal gas or fluid. Vascular/Lymphatic: Aortic atherosclerosis. No enlarged abdominal or pelvic lymph nodes. Reproductive: Status post hysterectomy. No adnexal masses. Other: Tiny fat containing umbilical hernia Musculoskeletal: Osseous structures are diffusely osteopenic. No acute bone abnormality. No lytic or blastic bone  lesion. IMPRESSION: 1. Acute right fifth rib fracture anteriorly. No pneumothorax. Trace right pleural effusion. 2. Mild emphysema. 3. Mild hepatic steatosis. 4. Mild distal colonic diverticulosis without superimposed acute inflammatory change. Aortic Atherosclerosis (ICD10-I70.0) and Emphysema (ICD10-J43.9). Electronically Signed   By: Helyn Numbers M.D.   On: 07/04/2022 04:14    Medications Ordered in ED Medications  fentaNYL (SUBLIMAZE) injection 50 mcg (50 mcg Intravenous Given 07/04/22 0304)  sodium chloride 0.9 % bolus 1,000 mL (0 mLs Intravenous Stopped 07/04/22 0447)  HYDROmorphone (DILAUDID) injection 0.5 mg (0.5 mg Intravenous Given 07/04/22 0340)  iohexol (OMNIPAQUE) 350 MG/ML injection 75 mL (75 mLs Intravenous Contrast Given 07/04/22 0354)  oxyCODONE (Oxy IR/ROXICODONE) immediate release tablet 5 mg (5 mg Oral Given 07/04/22 0445)   Procedures Procedures  (including critical care time) Medical Decision Making / ED Course   Medical Decision Making Amount and/or Complexity of Data Reviewed Labs: ordered. Decision-making details documented in ED Course. Radiology: ordered and independent interpretation performed. Decision-making details documented in ED Course.  Risk Prescription drug management.    Mechanical fall resulting in right torso pain.  On exam patient has right upper quadrant abdominal pain.  Concern for rib fractures but will need to rule out any solid organ injury more specifically liver.  Will obtain CT. will also assess for other bony injuries.   Clinical Course as of 07/04/22 0451  Sun Jul 04, 2022  0408 CBC without leukocytosis or anemia Metabolic panel without significant electrolyte derangements or renal insufficiency.   [PC]  0448 CT notable for right 5th rib fracture. No PTx. No PNA. No other acute injuries.  Provided with oral pain meds and incentive spirometer. [PC]    Clinical Course User Index [PC] Erian Rosengren, Amadeo Garnet, MD    Final Clinical  Impression(s) / ED Diagnoses Final diagnoses:  Fall, initial encounter  Closed fracture of one rib of right side, initial encounter   The patient appears reasonably screened and/or stabilized for discharge and I doubt any other medical condition or other [facility] requiring further screening, evaluation, or treatment in the ED at this time. I have discussed the findings, Dx and Tx plan with the patient/family who expressed understanding and agree(s) with the plan. Discharge instructions discussed at length. The patient/family was given strict return precautions who verbalized understanding of the instructions. No further questions at time of discharge.  Disposition: Discharge  Condition: Good  ED Discharge Orders          Ordered    oxyCODONE (ROXICODONE) 5 MG immediate release tablet  Every 6 hours PRN        07/04/22 0451            Follow Up: Merri Brunette, MD [address] [facility]  [address] [place] [address] [phone]  Call  to schedule an appointment for close follow up    This chart was dictated using voice recognition software.  Despite best efforts to proofread,  errors can occur which can change the documentation meaning.    Nira Conn, MD 07/04/22 [phone]

## 2022-07-04 NOTE — ED Triage Notes (Signed)
Pt arrives via POV accompanied by her Sundra Aland. Pt sts concern following a fall on Tuesday. She was doing yard work when she began to lost her balance while walking on gravel causing her to fall onto her right side. Unclear if pt was dizziness/lightheaded, sts she was feeling like she was going to fall out. No lightheaded/dizziness since Tuesday. No LOC. No blood thinner. Sts concern for right side rib pain since the fall. A&Ox4.

## 2022-07-04 NOTE — Discharge Instructions (Addendum)
Thank you for allowing [place] to take care of you today.  We hope you begin feeling better soon.  To-Do: Please follow-up with your primary doctor. For pain control you may take 1000 mg of Tylenol every 8 hours scheduled.  In addition you can take 0.5 to 1 tablet of Oxycodone every 6 hours as needed for pain not controlled with the scheduled Tylenol. Incentive spirometer: Use frequently to prevent pneumonia.  Recommended usage: 10 deep inhalations through spirometer every 2-3 hours for 7-10 days. Please return to the Emergency Department or call 911 if you experience chest pain, shortness of breath, severe pain, severe fever, altered mental status, or have any reason to think that you need emergency medical care.  Thank you again.  Hope you feel better soon.  Department of Emergency Medicine [facility]

## 2022-07-06 DIAGNOSIS — Z0000 Encounter for general adult medical examination without abnormal findings: Secondary | ICD-10-CM | POA: Diagnosis not present

## 2022-07-06 DIAGNOSIS — E118 Type 2 diabetes mellitus with unspecified complications: Secondary | ICD-10-CM | POA: Diagnosis not present

## 2022-07-06 DIAGNOSIS — M810 Age-related osteoporosis without current pathological fracture: Secondary | ICD-10-CM | POA: Diagnosis not present

## 2022-07-06 DIAGNOSIS — I201 Angina pectoris with documented spasm: Secondary | ICD-10-CM | POA: Diagnosis not present

## 2022-07-06 DIAGNOSIS — J439 Emphysema, unspecified: Secondary | ICD-10-CM | POA: Diagnosis not present

## 2022-07-06 DIAGNOSIS — Z23 Encounter for immunization: Secondary | ICD-10-CM | POA: Diagnosis not present

## 2022-07-06 DIAGNOSIS — I10 Essential (primary) hypertension: Secondary | ICD-10-CM | POA: Diagnosis not present

## 2022-07-06 DIAGNOSIS — I700 Atherosclerosis of aorta: Secondary | ICD-10-CM | POA: Diagnosis not present

## 2022-07-06 DIAGNOSIS — J449 Chronic obstructive pulmonary disease, unspecified: Secondary | ICD-10-CM | POA: Diagnosis not present

## 2022-07-06 DIAGNOSIS — K219 Gastro-esophageal reflux disease without esophagitis: Secondary | ICD-10-CM | POA: Diagnosis not present

## 2022-07-09 DIAGNOSIS — H353221 Exudative age-related macular degeneration, left eye, with active choroidal neovascularization: Secondary | ICD-10-CM | POA: Diagnosis not present

## 2022-07-15 ENCOUNTER — Encounter (HOSPITAL_COMMUNITY): Payer: Self-pay | Admitting: Emergency Medicine

## 2022-07-16 ENCOUNTER — Emergency Department (HOSPITAL_COMMUNITY): Payer: Medicare Other

## 2022-07-16 ENCOUNTER — Other Ambulatory Visit: Payer: Self-pay

## 2022-07-16 ENCOUNTER — Emergency Department (HOSPITAL_COMMUNITY)
Admission: EM | Admit: 2022-07-16 | Discharge: 2022-07-16 | Disposition: A | Discharge status: Home / Self Care | Payer: Medicare Other | Attending: Emergency Medicine | Admitting: Emergency Medicine

## 2022-07-16 ENCOUNTER — Encounter (HOSPITAL_COMMUNITY): Payer: Self-pay

## 2022-07-16 DIAGNOSIS — R0789 Other chest pain: Secondary | ICD-10-CM | POA: Diagnosis not present

## 2022-07-16 DIAGNOSIS — I700 Atherosclerosis of aorta: Secondary | ICD-10-CM | POA: Diagnosis not present

## 2022-07-16 DIAGNOSIS — S2231XA Fracture of one rib, right side, initial encounter for closed fracture: Secondary | ICD-10-CM | POA: Diagnosis not present

## 2022-07-16 DIAGNOSIS — I2510 Atherosclerotic heart disease of native coronary artery without angina pectoris: Secondary | ICD-10-CM | POA: Insufficient documentation

## 2022-07-16 DIAGNOSIS — Z7982 Long term (current) use of aspirin: Secondary | ICD-10-CM | POA: Diagnosis not present

## 2022-07-16 DIAGNOSIS — R0781 Pleurodynia: Secondary | ICD-10-CM | POA: Diagnosis not present

## 2022-07-16 DIAGNOSIS — J90 Pleural effusion, not elsewhere classified: Secondary | ICD-10-CM | POA: Insufficient documentation

## 2022-07-16 DIAGNOSIS — S2231XD Fracture of one rib, right side, subsequent encounter for fracture with routine healing: Secondary | ICD-10-CM

## 2022-07-16 DIAGNOSIS — R918 Other nonspecific abnormal finding of lung field: Secondary | ICD-10-CM | POA: Diagnosis not present

## 2022-07-16 DIAGNOSIS — S299XXA Unspecified injury of thorax, initial encounter: Secondary | ICD-10-CM | POA: Diagnosis not present

## 2022-07-16 DIAGNOSIS — J9811 Atelectasis: Secondary | ICD-10-CM | POA: Diagnosis not present

## 2022-07-16 DIAGNOSIS — R1013 Epigastric pain: Secondary | ICD-10-CM | POA: Insufficient documentation

## 2022-07-16 LAB — CBC WITH DIFFERENTIAL/PLATELET
Abs Immature Granulocytes: 0.12 10*3/uL — ABNORMAL HIGH (ref 0.00–0.07)
Basophils Absolute: 0.1 10*3/uL (ref 0.0–0.1)
Basophils Relative: 1 %
Eosinophils Absolute: 0.3 10*3/uL (ref 0.0–0.5)
Eosinophils Relative: 3 %
HCT: 39.7 % (ref 36.0–46.0)
Hemoglobin: 12.7 g/dL (ref 12.0–15.0)
Immature Granulocytes: 1 %
Lymphocytes Relative: 17 %
Lymphs Abs: 2.2 10*3/uL (ref 0.7–4.0)
MCH: 30.4 pg (ref 26.0–34.0)
MCHC: 32 g/dL (ref 30.0–36.0)
MCV: 95 fL (ref 80.0–100.0)
Monocytes Absolute: 1.1 10*3/uL — ABNORMAL HIGH (ref 0.1–1.0)
Monocytes Relative: 8 %
Neutro Abs: 9.1 10*3/uL — ABNORMAL HIGH (ref 1.7–7.7)
Neutrophils Relative %: 70 %
Platelets: 274 10*3/uL (ref 150–400)
RBC: 4.18 MIL/uL (ref 3.87–5.11)
RDW: 13.7 % (ref 11.5–15.5)
WBC: 12.9 10*3/uL — ABNORMAL HIGH (ref 4.0–10.5)
nRBC: 0 % (ref 0.0–0.2)

## 2022-07-16 LAB — COMPREHENSIVE METABOLIC PANEL
ALT: 19 U/L (ref 0–44)
AST: 22 U/L (ref 15–41)
Albumin: 3.3 g/dL — ABNORMAL LOW (ref 3.5–5.0)
Alkaline Phosphatase: 111 U/L (ref 38–126)
Anion gap: 8 (ref 5–15)
BUN: 12 mg/dL (ref 8–23)
CO2: 23 mmol/L (ref 22–32)
Calcium: 8.3 mg/dL — ABNORMAL LOW (ref 8.9–10.3)
Chloride: 107 mmol/L (ref 98–111)
Creatinine, Ser: 0.63 mg/dL (ref 0.44–1.00)
GFR, Estimated: >60 mL/min (ref >60)
Glucose, Bld: 140 mg/dL — ABNORMAL HIGH (ref 70–99)
Potassium: 3.6 mmol/L (ref 3.5–5.1)
Sodium: 138 mmol/L (ref 135–145)
Total Bilirubin: 0.6 mg/dL (ref 0.3–1.2)
Total Protein: 6.7 g/dL (ref 6.5–8.1)

## 2022-07-16 LAB — LIPASE, BLOOD: Lipase: 30 U/L (ref 11–51)

## 2022-07-16 LAB — TROPONIN I (HIGH SENSITIVITY)
Troponin I (High Sensitivity): 5 ng/L (ref <18)
Troponin I (High Sensitivity): 5 ng/L (ref <18)

## 2022-07-16 LAB — BRAIN NATRIURETIC PEPTIDE: B Natriuretic Peptide: 211.1 pg/mL — ABNORMAL HIGH (ref 0.0–100.0)

## 2022-07-16 MED ORDER — IOHEXOL 350 MG/ML SOLN
50.0000 mL | Freq: Once | INTRAVENOUS | Status: AC | PRN
  Administered 2022-07-16: 50 mL via INTRAVENOUS

## 2022-07-16 MED ORDER — HYDROMORPHONE HCL 1 MG/ML IJ SOLN
1.0000 mg | Freq: Once | INTRAMUSCULAR | Status: AC
  Administered 2022-07-16: 1 mg via INTRAVENOUS
  Filled 2022-07-16: qty 1

## 2022-07-16 NOTE — Discharge Instructions (Addendum)
You were seen in the emergency room today with chest discomfort.  Your lab work and CT imaging showed the known rib fracture on the right but no new pneumonia, blood clots in the lungs, evidence of heart attack.  Please continue your home medications and follow closely with your primary care doctor.  Return with any new or suddenly worsening symptoms.

## 2022-07-16 NOTE — ED Provider Notes (Signed)
Blood pressure 135/73, pulse 62, temperature 98.3 F (36.8 C), temperature source Oral, resp. rate 20, height 5\' 4"  (1.626 m), weight 68 kg, SpO2 94%.  Assuming care from Dr. Geronimo Running.  In short, Sherri King is a 82 y.o. female with a chief complaint of Chest Pain (Pt reports midsternal, epigastric and R flank pain. Took an oxycodone which was recently prescribed to her due to a broken rib with no relief) .  Refer to the original H&P for additional details.  The current plan of care is to follow up on CTA, labs, UA.  08:50 AM  Troponin within normal limits.  CTA independently interpreted and agree with radiology that there is no evidence of PE.  Known right rib fracture visualized with small effusion but no evidence of pneumonia.  Patient is not having pneumonia symptoms.  She is not hypoxemic.  Pain is well-controlled.  I do not appreciate any increased work of breathing.  Patient appears stable for discharge with plan for close PCP follow-up.  Patient and family comfortable with the plan at discharge.    Maia Plan, MD 07/16/22 [phone]

## 2022-07-16 NOTE — ED Provider Notes (Signed)
[place] EMERGENCY DEPARTMENT AT [facility] Provider Note   CSN: [id number] Arrival date & time: 07/16/22  7829     History  Chief Complaint  Patient presents with   Chest Pain    Pt reports midsternal, epigastric and R flank pain. Took an oxycodone which was recently prescribed to her due to a broken rib with no relief    Sherri King is a 82 y.o. female.  28 female presents the ER today with right-sided chest pain.  Nursing note states that is retrosternal however she is adamant that it is related to her broken rib on the right.  She states that its sharp and severe in nature seems to be worse today than normal however she has any recent falls or exacerbating factors.  She took her oxycodone at home which seemed to help a little bit.  It is hard to take a deep breath because of pain.  I reviewed the records it appears that her rib fracture was anterior fifth however her pain is more lateral/posterior and slightly lower.  She denies any urinary symptoms.  She denies any recent fever or cough.  No rashes.  No other associated symptoms.   Chest Pain      Home Medications Prior to Admission medications   Medication Sig Start Date End Date Taking? Authorizing Provider  acetaminophen (TYLENOL) 500 MG tablet Take 500 mg by mouth every 6 (six) hours as needed for mild pain.     [provider]  aspirin EC 81 MG tablet Take 81 mg by mouth daily.    [provider]  carboxymethylcellulose (REFRESH PLUS) 0.5 % SOLN Place 1 drop into both eyes 2 (two) times daily as needed (dryness).     [provider]  cholecalciferol (VITAMIN D) 1000 units tablet Take 1,000 Units by mouth daily.    [provider]  denosumab (PROLIA) 60 MG/ML SOSY injection 60 mg    [provider]  metFORMIN (GLUCOPHAGE-XR) 750 MG 24 hr tablet SMARTSIG:1 Tablet(s) By Mouth Every Evening 04/03/20   [provider]  Multiple Vitamins-Minerals  (MULTIVITAMIN WITH MINERALS) tablet Take 1 tablet by mouth daily.    [provider]  nitroGLYCERIN (NITROSTAT) 0.4 MG SL tablet Place 1 tablet (0.4 mg total) under the tongue every 5 (five) minutes as needed for chest pain. 06/23/20   [place], Peter M, MD  Omega-3 1000 MG CAPS Take 1 capsule by mouth daily.    [provider]  omeprazole (PRILOSEC) 20 MG capsule Take 1 capsule (20 mg total) by mouth daily for 14 days. 04/04/18 07/02/19  McDonald, Mia A, PA-C  oxyCODONE-acetaminophen (PERCOCET/ROXICET) 5-325 MG tablet Take 1 tablet by mouth every 6 (six) hours as needed for severe pain. 01/18/22   Lorre Nick, MD  Potassium 99 MG TABS 1 tablet    [provider]  rosuvastatin (CRESTOR) 5 MG tablet Take 5 mg by mouth daily. 06/19/19   [provider]  sertraline (ZOLOFT) 50 MG tablet Take 50 mg by mouth daily as needed (anxiety).  07/29/17   [provider]  traMADol (ULTRAM) 50 MG tablet Take 50-100 mg by mouth every 12 (twelve) hours as needed for moderate pain.  08/30/17   [provider]  TRELEGY ELLIPTA 100-62.5-25 MCG/INH AEPB Inhale 1 puff into the lungs daily.  07/19/18   [provider]      Allergies    Bee venom    Review of Systems   Review of Systems  Cardiovascular:  Positive for chest pain.    Physical Exam Updated Vital Signs BP (!) 137/109 (BP Location: Right Arm)   Pulse 70   Temp 98.3 F (36.8 C) (Oral)   Resp 20   Ht 5\' 4"  (1.626 m)   Wt 68 kg   SpO2 95%   BMI 25.75 kg/m  Physical Exam Vitals and nursing note reviewed.  Constitutional:      Appearance: She is well-developed.  HENT:     Head: Normocephalic and atraumatic.  Cardiovascular:     Rate and Rhythm: Normal rate and regular rhythm.  Pulmonary:     Effort: No respiratory distress.     Breath sounds: No stridor.  Chest:     Chest wall: Tenderness (Right lateral lower rib cage) present.  Abdominal:     General: There is no distension.   Musculoskeletal:     Cervical back: Normal range of motion.  Neurological:     Mental Status: She is alert.     ED Results / Procedures / Treatments   Labs (all labs ordered are listed, but only abnormal results are displayed) Labs Reviewed  URINALYSIS, ROUTINE W REFLEX MICROSCOPIC    EKG None  Radiology No results found.  Procedures Procedures    Medications Ordered in ED Medications  HYDROmorphone (DILAUDID) injection 1 mg (has no administration in time range)    ED Course/ Medical Decision Making/ A&P                             Medical Decision Making Amount and/or Complexity of Data Reviewed Labs: ordered. Radiology: ordered.  Risk Prescription drug management.  Her pain does not really seem to be in the area of her previously known rib fracture.  It does seem to be bony in nature based on exam.  Also consider possible liver/gallbladder pathology versus kidneys such as stone or pyelonephritis although she does not have any symptoms.  Will workup for same will treat her pain. Care transferred pending CT and reeval of symptoms.    Final Clinical Impression(s) / ED Diagnoses Final diagnoses:  None    Rx / DC Orders ED Discharge Orders     None         Ashara Lounsbury, Barbara Cower, MD 07/21/22 [phone]

## 2022-07-22 ENCOUNTER — Telehealth: Payer: Self-pay | Admitting: *Deleted

## 2022-07-22 NOTE — Telephone Encounter (Signed)
Transition Care Management Follow-up Telephone Call Date of discharge and from where: Portal ed 07/16/2022 How have you been since you were released from the hospital? Feeling better  Any questions or concerns? No  Items Reviewed: Did the pt receive and understand the discharge instructions provided? Yes  Medications obtained and verified? No  Other? No  Any new allergies since your discharge? No  Dietary orders reviewed? No Do you have support at home?yes      Follow up appointments reviewed:  PCP Hospital f/u appt confirmed  : already followed up with pcp  Are transportation arrangements needed? No  If their condition worsens, is the pt aware to call PCP or go to the Emergency Dept.? Yes Was the patient provided with contact information for the PCP's office or ED? Yes Was to pt encouraged to call back with questions or concerns? Yes

## 2022-08-11 DIAGNOSIS — Z0289 Encounter for other administrative examinations: Secondary | ICD-10-CM | POA: Diagnosis not present

## 2022-08-13 DIAGNOSIS — H353221 Exudative age-related macular degeneration, left eye, with active choroidal neovascularization: Secondary | ICD-10-CM | POA: Diagnosis not present

## 2022-08-18 ENCOUNTER — Ambulatory Visit: Payer: Medicare Other | Admitting: Urology

## 2022-08-19 ENCOUNTER — Encounter: Payer: Self-pay | Admitting: Urology

## 2022-08-19 ENCOUNTER — Ambulatory Visit: Payer: Medicare Other | Admitting: Urology

## 2022-08-19 VITALS — BP 147/74 | HR 72 | Ht 64.0 in | Wt 161.0 lb

## 2022-08-19 DIAGNOSIS — R8271 Bacteriuria: Secondary | ICD-10-CM

## 2022-08-19 DIAGNOSIS — R8281 Pyuria: Secondary | ICD-10-CM | POA: Diagnosis not present

## 2022-08-19 DIAGNOSIS — R399 Unspecified symptoms and signs involving the genitourinary system: Secondary | ICD-10-CM

## 2022-08-19 DIAGNOSIS — N3000 Acute cystitis without hematuria: Secondary | ICD-10-CM

## 2022-08-19 DIAGNOSIS — N3941 Urge incontinence: Secondary | ICD-10-CM

## 2022-08-19 DIAGNOSIS — R351 Nocturia: Secondary | ICD-10-CM

## 2022-08-19 DIAGNOSIS — R300 Dysuria: Secondary | ICD-10-CM | POA: Diagnosis not present

## 2022-08-19 DIAGNOSIS — R350 Frequency of micturition: Secondary | ICD-10-CM | POA: Diagnosis not present

## 2022-08-19 LAB — URINALYSIS, ROUTINE W REFLEX MICROSCOPIC
Bilirubin, UA: NEGATIVE
Glucose, UA: NEGATIVE
Nitrite, UA: POSITIVE — AB
Specific Gravity, UA: 1.03 (ref 1.005–1.030)
Urobilinogen, Ur: 0.2 mg/dL (ref 0.2–1.0)
pH, UA: 5.5 (ref 5.0–7.5)

## 2022-08-19 LAB — MICROSCOPIC EXAMINATION: WBC, UA: >30 /hpf — AB (ref 0–5)

## 2022-08-19 MED ORDER — CEPHALEXIN 250 MG PO CAPS: 250.0000 mg | ORAL_CAPSULE | Freq: Every evening | ORAL | 0 refills | Status: DC

## 2022-08-19 MED ORDER — CEFDINIR 300 MG PO CAPS: 300.0000 mg | ORAL_CAPSULE | Freq: Two times a day (BID) | ORAL | 0 refills | Status: DC

## 2022-08-19 NOTE — Progress Notes (Signed)
Assessment: 1. Acute cystitis without hematuria      Plan: Today I had a long discussion with the patient regarding her lower urinary tract symptoms and apparent current urinary tract infection.  I told her our initial focus will be on clearing her urine and to see how her bladder recovers after treating her UTI. She may have some baseline OAB type symptoms warranting additional medical treatment.  Urine for culture today. Will treat empirically with Omnicef 300 twice daily for 5 days Following definitive treatment patient will begin low-dose suppression  Chief Complaint: luts  History of Present Illness:  Sherri King is a 82 y.o. female with significant past medical history as listed below including type 2 diabetes, COPD, and hypertension who is seen in consultation from Merri Brunette, MD for evaluation of LUTS. Patient reports a long history of lower urinary tract symptoms and in fact reports that her PCP placed her on a medication several years ago that seemed to help at first but then lost its effectiveness.  Currently she reports dysuria, frequency, urgency, occasional urge incontinence as well as nocturia x 3-5.  Urinalysis today is consistent with UTI with marked pyuria and many bacteria.   Past Medical History:  Past Medical History:  Diagnosis Date   Anxiety    Arthritis    Cancer (HCC)    cervical   COPD (chronic obstructive pulmonary disease) (HCC)    Coronary vasospasm (HCC)    Depression    GERD (gastroesophageal reflux disease)    Hyperlipidemia    Hypertension    Multiple thyroid nodules     Past Surgical History:  Past Surgical History:  Procedure Laterality Date   ABDOMINAL HYSTERECTOMY     due to cancer   BREAST BIOPSY Left 08/21/2014   BREAST BIOPSY Right 2016   several "nodules' removed   BREAST EXCISIONAL BIOPSY Left 10/2014   BREAST SURGERY Bilateral 2016   removal of several nodules from right breast   RADIOACTIVE SEED GUIDED  EXCISIONAL BREAST BIOPSY Left 10/31/2014   Procedure: RADIOACTIVE SEED GUIDED EXCISIONAL LEFT BREAST BIOPSY;  Surgeon: Emelia Loron, MD;  Location: [facility];  Service: General;  Laterality: Left;   THYROIDECTOMY, PARTIAL     TUBAL LIGATION     VESICOVAGINAL FISTULA CLOSURE W/ TAH      Allergies:  Allergies  Allergen Reactions   Bee Venom Diarrhea, Nausea And Vomiting and Swelling    Family History:  Family History  Problem Relation Age of Onset   Heart failure Mother    Stroke Mother    Cancer Father        lung   Cancer Sister        lung, mets to brain   Breast cancer Sister    Cancer Sister        lung   Breast cancer Sister    Heart disease Sister    Allergies Sister        all 8 sisters   Breast cancer Other    Cancer Other        bone   Breast cancer Other     Social History:  Social History   Tobacco Use   Smoking status: Former    Current packs/day: 0.00    Average packs/day: 1 pack/day for 60.0 years (60.0 ttl pk-yrs)    Types: Cigarettes    Start date: 04/28/1954    Quit date: 04/28/2014    Years since quitting: 8.3   Smokeless tobacco: Never  Vaping Use   Vaping status: Never Used  Substance Use Topics   Alcohol use: Yes    Alcohol/week: 0.0 standard drinks of alcohol    Comment: rarely a beer   Drug use: No    Review of symptoms:  Constitutional:  Negative for unexplained weight loss, night sweats, fever, chills ENT:  Negative for nose bleeds, sinus pain, painful swallowing CV:  Negative for chest pain, shortness of breath, exercise intolerance, palpitations, loss of consciousness Resp:  Negative for cough, wheezing, shortness of breath GI:  Negative for nausea, vomiting, diarrhea, bloody stools GU:  Positives noted in HPI; otherwise negative for gross hematuria, dysuria, urinary incontinence Neuro:  Negative for seizures, poor balance, limb weakness, slurred speech Psych:  Negative for lack of energy, depression,  anxiety Endocrine:  Negative for polydipsia, polyuria, symptoms of hypoglycemia (dizziness, hunger, sweating) Hematologic:  Negative for anemia, purpura, petechia, prolonged or excessive bleeding, use of anticoagulants  Allergic:  Negative for difficulty breathing or choking as a result of exposure to anything; no shellfish allergy; no allergic response (rash/itch) to materials, foods  Physical exam: BP (!) 147/74   Pulse 72   Ht 5\' 4"  (1.626 m)   Wt 161 lb (73 kg)   BMI 27.64 kg/m  GENERAL APPEARANCE:  Well appearing, well developed, well nourished, NAD   Results: Urinalysis is compatible with UTI given significant pyuria and bacteriuria

## 2022-08-21 LAB — URINE CULTURE

## 2022-08-27 NOTE — Progress Notes (Unsigned)
Cardiology Office Note:    Date:  08/31/2022   ID:  Sherri King, DOB April 29, 1940, MRN [id number]  PCP:  Merri Brunette, MD  Cardiologist:  Peter Swaziland, MD  Electrophysiologist:  None   Referring MD: Merri Brunette, MD   Chief Complaint: follow-up of vasospastic CAD  History of Present Illness:    Sherri King is a 82 y.o. female with a history of vasospastic CAD noted on cardiac catheterization in 2009, hypertension, hyperlipidemia, type 2 diabetes mellitus, GERD, COPD, multiple thyroid nodules, anxiety/ depression, and prior tobacco abuse who is followed by Dr. Swaziland and presents today for routine follow-up.   Patient has a history of intermittent atypical chest pain over the years felt to be secondary to vasospastic CAD. Cardiac catheterization in 2009 showed an area of narrowing in the mid LAD that resolved with Nitroglycerin. She was previously on Imdur but this was stopped several years ago. She was last seen by Dr. Swaziland in 06/2020 at which time she continued to report some intermittent pain in the center of her chest at rest but it was fleeting and only lasted for a couple of seconds a a time. She also reported pain in bilateral shins. Chest pain was felt to be atypical. She was given a prescription of sublingual Nitroglycerin and advised to call us if symptoms worsened. ABIs were ordered for further evaluation of leg pain and were normal.  Patient has had 2 mechanical falls over the last 8 months (one in 01/2022 and one in 06/2022) both times prompting an ED visit. She was found to have a left humerus fracture after her fall in 01/2022 and a right 5th rib fracture after the one in 06/2022.   She presented back to the ED on 07/16/2022 for evaluation of right sided chest pain, epigastric pain, and right flank pain. EKG showed no acute ischemic changes. High-sensitivity troponin negative x2 BNP was mildly elevated at 211. Chest CTA was negative for PE but showed an acute  non-displaced anterior right firth rib fracture (similar to prior CT) and a small right pleural effusion with passive subsegmental atelectasis in the base of the right lower lobe. Also showed cardiomegaly with left atrial dilatation and 2 vessel coronary artery disease. She was felt to be stable for discharge and was advised to follow-up with her PCP.  Patient presents today for follow-up. Here with her son. Overall doing well. We discussed her 2 falls this year and they do sound mechanical in nature (she got tripped up on the carpet and on the gravel). She denies any lightheadedness or dizziness prior to these falls. She continues to have occasional atypical chest pain that she describes as a sharp pain (mainly in her breast) that last for 1-2 seconds and then resolves. She has this both at rest and with exertion but it is not any worse with exertion. She states this occurs rarely. She has some chronic dyspnea both at rest and with exertion due to her COPD but this is stable. She states she does well with her Trelegy inhaler. She denies any orthopnea, PND, or edema. She denies any palpitations. She has occasional lightheadedness/ dizziness with standing but it does not typically last sound and improves if she sits back down. No falls or syncope with this.   Of note, she does states she has been diagnosed with type 2 diabetes since she last saw [place] and is now on Metformin. Hemoglobin A1c 6.1% in 09/2021.  EKGs/Labs/Other Studies Reviewed:  The following studies were reviewed:  ABIs/ TBIs 07/24/2020: Summary:  Right: Resting right ankle-brachial index is within normal range. No  evidence of significant right lower extremity arterial disease. The right  toe-brachial index is normal.   Left: Resting left ankle-brachial index is within normal range. No  evidence of significant left lower extremity arterial disease. The left  toe-brachial index is normal.    EKG:  EKG not ordered today. Recent EKG  from 07/16/2022 showed normal sinus rhythm, rate 75 bpm, with borderline short PR interval (119 ms) but no acute ST/T wave changes. Normal axis. QTc 435 ms.  Recent Labs: 07/16/2022: ALT 19; B Natriuretic Peptide 211.1; BUN 12; Creatinine, Ser 0.63; Hemoglobin 12.7; Platelets 274; Potassium 3.6; Sodium 138  Recent Lipid Panel No results found for: "CHOL", "TRIG", "HDL", "CHOLHDL", "VLDL", "LDLCALC", "LDLDIRECT"  Physical Exam:    Vital Signs: BP 118/82   Pulse 90   Ht 5\' 4"  (1.626 m)   Wt 160 lb 3.2 oz (72.7 kg)   SpO2 91%   BMI 27.50 kg/m     Wt Readings from Last 3 Encounters:  08/31/22 160 lb 3.2 oz (72.7 kg)  08/19/22 161 lb (73 kg)  07/16/22 150 lb (68 kg)     General: 82 y.o. Caucasian female in no acute distress. HEENT: Normocephalic and atraumatic. Sclera clear.  Neck: Supple. No carotid bruits. No JVD. Heart: RRR. Distinct S1 and S2. No murmurs, gallops, or rubs.  Lungs: No increased work of breathing. Clear to ausculation bilaterally. No wheezes, rhonchi, or rales.  Abdomen: Soft, non-distended, and non-tender to palpation.  Extremities: No lower extremity edema.    Skin: Warm and dry. Neuro: Alert and oriented x3. No focal deficits. Psych: Normal affect. Responds appropriately.  Assessment:    1. Coronary vasospasm (HCC)   2. Coronary artery calcification   3. Chronic dyspnea   4. Primary hypertension   5. Hyperlipidemia, unspecified hyperlipidemia type   6. Type 2 diabetes mellitus without obesity (HCC)     Plan:    Vasospastic CAD Coronary Artery Calcifications  Patient has a history of intermittent atypical chest pain felt to be due to coronary vasospasms. Cardiac catheterization in 2009 showed an area of narrowing in the mid LAD that resolved with Nitroglycerin. Recent chest CTA in 07/2022 did show aortic atherosclerosis and calcified atherosclerotic plaque in the LAD and RCA distributions.  - She continues to report occasional very atypical chest pain  that she describes as a sharp sensation that only last for a couple of seconds and then resolves. This does not occur often. - She was previously on long-acting Nitroglycerin but this was stopped several years ago. - Continue aspirin and statin.  - Symptoms do not sound cardiac in nature. Advised patient to let us know if she has any new or worsening symptoms.   Chronic Dyspnea Patient has chronic dyspnea both at rest and with exertion due to her COPD.  - Stable. She states she does well as long as she uses her Trelegy inhaler.  - No other signs of symptoms of CHF. - No additional cardiac work-up necessary at this. We could consider getting an Echo if this worsens but I don't think this is necessary at this time as dyspnea sounds secondary to COPD.  Hypertension History of hypertension but no longer on any antihypertensives.  - BP well controlled today.  Hyperlipidemia Lipid panel in 06/2022: Total Cholesterol 169, Triglycerides 80, HDL 63, LDL 91. LDL goal <70 given coronary artery calcifications.  -  Will increase Crestor to 10mg  daily. - Will repeat lipid panel and LFTs in 2-3 months.  Type 2 Diabetes Mellitus  Hemoglobin A1c 6.1% in 09/2021. - On Metformin. - Management per PCP.  Disposition: Follow up in 6 months.   Medication Adjustments/Labs and Tests Ordered: Current medicines are reviewed at length with the patient today.  Concerns regarding medicines are outlined above.  Orders Placed This Encounter  Procedures   Lipid panel   Hepatic function panel   Meds ordered this encounter  Medications   rosuvastatin (CRESTOR) 10 MG tablet    Sig: Take 1 tablet (10 mg total) by mouth daily.    Dispense:  90 tablet    Refill:  3    Patient Instructions  Medication Instructions:  Your physician has recommended you make the following change in your medication:  INCREASE CRESTOR TO 10 MG DAILY.  *If you need a refill on your cardiac medications before your next appointment,  please call your pharmacy*   Lab Work: TO BE DONE IN 2-3 MONTHS: FASTING LIPIDS, LFTS If you have labs (blood work) drawn today and your tests are completely normal, you will receive your results only by: MyChart Message (if you have MyChart) OR A paper copy in the mail If you have any lab test that is abnormal or we need to change your treatment, we will call you to review the results.   Testing/Procedures: NONE   Follow-Up: At [facility], you and your health needs are our priority.  As part of our continuing mission to provide you with exceptional heart care, we have created designated Provider Care Teams.  These Care Teams include your primary Cardiologist (physician) and Advanced Practice Providers (APPs -  Physician Assistants and Nurse Practitioners) who all work together to provide you with the care you need, when you need it.  We recommend signing up for the patient portal called "MyChart".  Sign up information is provided on this After Visit Summary.  MyChart is used to connect with patients for Virtual Visits (Telemedicine).  Patients are able to view lab/test results, encounter notes, upcoming appointments, etc.  Non-urgent messages can be sent to your provider as well.   To learn more about what you can do with MyChart, go to [email or web address].    Your next appointment:   6 month(s)  Provider:   Peter Swaziland, MD  or Marjie Skiff, PA-C      Other Instructions YOUR PROVIDER RECOMMENDS THINGS TO DO TO HELP WITH ORTHOSTATIC HYPERTENSION STAY WELL HYDRATED  WEAR COMPRESSION STOCKINGS ELEVATE LEGS MOVING IN DIFFERENT POSITIONS SLOWLY    Signed, Corrin Parker, PA-C  08/31/2022 12:22 PM    [facility]

## 2022-08-31 ENCOUNTER — Encounter: Payer: Self-pay | Admitting: Student

## 2022-08-31 ENCOUNTER — Ambulatory Visit: Payer: Medicare Other | Admitting: Student

## 2022-08-31 VITALS — BP 118/82 | HR 90 | Ht 64.0 in | Wt 160.2 lb

## 2022-08-31 DIAGNOSIS — I25111 Atherosclerotic heart disease of native coronary artery with angina pectoris with documented spasm: Secondary | ICD-10-CM

## 2022-08-31 DIAGNOSIS — R0609 Other forms of dyspnea: Secondary | ICD-10-CM | POA: Diagnosis not present

## 2022-08-31 DIAGNOSIS — I2510 Atherosclerotic heart disease of native coronary artery without angina pectoris: Secondary | ICD-10-CM

## 2022-08-31 DIAGNOSIS — Z7984 Long term (current) use of oral hypoglycemic drugs: Secondary | ICD-10-CM

## 2022-08-31 DIAGNOSIS — I2584 Coronary atherosclerosis due to calcified coronary lesion: Secondary | ICD-10-CM

## 2022-08-31 DIAGNOSIS — E785 Hyperlipidemia, unspecified: Secondary | ICD-10-CM

## 2022-08-31 DIAGNOSIS — E119 Type 2 diabetes mellitus without complications: Secondary | ICD-10-CM | POA: Diagnosis not present

## 2022-08-31 DIAGNOSIS — I10 Essential (primary) hypertension: Secondary | ICD-10-CM

## 2022-08-31 DIAGNOSIS — I201 Angina pectoris with documented spasm: Secondary | ICD-10-CM

## 2022-08-31 MED ORDER — ROSUVASTATIN CALCIUM 10 MG PO TABS: 10.0000 mg | ORAL_TABLET | Freq: Every day | ORAL | 3 refills | Status: AC

## 2022-08-31 NOTE — Patient Instructions (Signed)
Medication Instructions:  Your physician has recommended you make the following change in your medication:  INCREASE CRESTOR TO 10 MG DAILY.  *If you need a refill on your cardiac medications before your next appointment, please call your pharmacy*   Lab Work: TO BE DONE IN 2-3 MONTHS: FASTING LIPIDS, LFTS If you have labs (blood work) drawn today and your tests are completely normal, you will receive your results only by: MyChart Message (if you have MyChart) OR A paper copy in the mail If you have any lab test that is abnormal or we need to change your treatment, we will call you to review the results.   Testing/Procedures: NONE   Follow-Up: At [facility], you and your health needs are our priority.  As part of our continuing mission to provide you with exceptional heart care, we have created designated Provider Care Teams.  These Care Teams include your primary Cardiologist (physician) and Advanced Practice Providers (APPs -  Physician Assistants and Nurse Practitioners) who all work together to provide you with the care you need, when you need it.  We recommend signing up for the patient portal called "MyChart".  Sign up information is provided on this After Visit Summary.  MyChart is used to connect with patients for Virtual Visits (Telemedicine).  Patients are able to view lab/test results, encounter notes, upcoming appointments, etc.  Non-urgent messages can be sent to your provider as well.   To learn more about what you can do with MyChart, go to [email or web address].    Your next appointment:   6 month(s)  Provider:   Peter Swaziland, MD  or Marjie Skiff, PA-C      Other Instructions YOUR PROVIDER RECOMMENDS THINGS TO DO TO HELP WITH ORTHOSTATIC HYPERTENSION STAY WELL HYDRATED  WEAR COMPRESSION STOCKINGS ELEVATE LEGS MOVING IN DIFFERENT POSITIONS SLOWLY

## 2022-09-10 DIAGNOSIS — H353221 Exudative age-related macular degeneration, left eye, with active choroidal neovascularization: Secondary | ICD-10-CM | POA: Diagnosis not present

## 2022-09-23 ENCOUNTER — Encounter: Payer: Self-pay | Admitting: Urology

## 2022-09-23 ENCOUNTER — Ambulatory Visit: Payer: Medicare Other | Admitting: Urology

## 2022-09-23 VITALS — BP 161/71 | HR 69 | Ht 64.0 in | Wt 160.0 lb

## 2022-09-23 DIAGNOSIS — R399 Unspecified symptoms and signs involving the genitourinary system: Secondary | ICD-10-CM | POA: Diagnosis not present

## 2022-09-23 DIAGNOSIS — N952 Postmenopausal atrophic vaginitis: Secondary | ICD-10-CM

## 2022-09-23 DIAGNOSIS — N3281 Overactive bladder: Secondary | ICD-10-CM | POA: Diagnosis not present

## 2022-09-23 DIAGNOSIS — Z87440 Personal history of urinary (tract) infections: Secondary | ICD-10-CM | POA: Diagnosis not present

## 2022-09-23 LAB — URINALYSIS, ROUTINE W REFLEX MICROSCOPIC
Bilirubin, UA: NEGATIVE
Glucose, UA: NEGATIVE
Leukocytes,UA: NEGATIVE
Nitrite, UA: NEGATIVE
Protein,UA: NEGATIVE
Specific Gravity, UA: >1.03 — ABNORMAL HIGH (ref 1.005–1.030)
Urobilinogen, Ur: 0.2 mg/dL (ref 0.2–1.0)
pH, UA: 5.5 (ref 5.0–7.5)

## 2022-09-23 LAB — MICROSCOPIC EXAMINATION

## 2022-09-23 MED ORDER — ESTRADIOL 0.1 MG/GM VA CREA
TOPICAL_CREAM | VAGINAL | 12 refills | Status: AC
Start: 2022-09-23 — End: ?

## 2022-09-23 NOTE — Progress Notes (Signed)
   Assessment: 1. OAB (overactive bladder)   2. Atrophic vaginitis     Plan: UTI prevention reviewed in detail Will begin Vag estrogen cream (rx estrace) FU 4 months  Consider female exam on FU  Chief Complaint: FU UTI  HPI: Sherri King is a 82 y.o. female who presents for continued evaluation of lower urinary tract symptoms as well as recent Klebsiella UTI. Patient has completed appropriate antibiotics and is currently taking low-dose suppression with Keflex 250 nightly. It is unclear whether she has had recurrent UTIs or not but she has had fairly longstanding OAB type symptoms.  She previously took what sounds like an anticholinergic that seemed to help for a while and then lost its effectiveness.  At present patient states that she feels well and that all of her lower urinary tract symptoms have resolved.   Portions of the above documentation were copied from a prior visit for review purposes only.  Allergies: Allergies  Allergen Reactions   Bee Venom Diarrhea, Nausea And Vomiting and Swelling    PMH: Past Medical History:  Diagnosis Date   Anxiety    Arthritis    Cancer (HCC)    cervical   COPD (chronic obstructive pulmonary disease) (HCC)    Coronary vasospasm (HCC)    Depression    GERD (gastroesophageal reflux disease)    Hyperlipidemia    Hypertension    Multiple thyroid nodules     PSH: Past Surgical History:  Procedure Laterality Date   ABDOMINAL HYSTERECTOMY     due to cancer   BREAST BIOPSY Left 08/21/2014   BREAST BIOPSY Right 2016   several "nodules' removed   BREAST EXCISIONAL BIOPSY Left 10/2014   BREAST SURGERY Bilateral 2016   removal of several nodules from right breast   RADIOACTIVE SEED GUIDED EXCISIONAL BREAST BIOPSY Left 10/31/2014   Procedure: RADIOACTIVE SEED GUIDED EXCISIONAL LEFT BREAST BIOPSY;  Surgeon: Emelia Loron, MD;  Location: [facility];  Service: General;  Laterality: Left;   THYROIDECTOMY,  PARTIAL     TUBAL LIGATION     VESICOVAGINAL FISTULA CLOSURE W/ TAH      SH: Social History   Tobacco Use   Smoking status: Former    Current packs/day: 0.00    Average packs/day: 1 pack/day for 60.0 years (60.0 ttl pk-yrs)    Types: Cigarettes    Start date: 04/28/1954    Quit date: 04/28/2014    Years since quitting: 8.4   Smokeless tobacco: Never  Vaping Use   Vaping status: Never Used  Substance Use Topics   Alcohol use: Yes    Alcohol/week: 0.0 standard drinks of alcohol    Comment: rarely a beer   Drug use: No    ROS: Constitutional:  Negative for fever, chills, weight loss CV: Negative for chest pain, previous MI, hypertension Respiratory:  Negative for shortness of breath, wheezing, sleep apnea, frequent cough GI:  Negative for nausea, vomiting, bloody stool, GERD  PE: Vitals:   09/23/22 1413  BP: (!) 161/71  Pulse: 69    GENERAL APPEARANCE:  Well appearing, well developed, well nourished, NAD    Results: UA clear

## 2022-10-08 DIAGNOSIS — H353113 Nonexudative age-related macular degeneration, right eye, advanced atrophic without subfoveal involvement: Secondary | ICD-10-CM | POA: Diagnosis not present

## 2022-10-27 ENCOUNTER — Other Ambulatory Visit: Payer: Self-pay | Admitting: Urology

## 2022-11-05 DIAGNOSIS — H353221 Exudative age-related macular degeneration, left eye, with active choroidal neovascularization: Secondary | ICD-10-CM | POA: Diagnosis not present

## 2022-12-10 DIAGNOSIS — H353221 Exudative age-related macular degeneration, left eye, with active choroidal neovascularization: Secondary | ICD-10-CM | POA: Diagnosis not present

## 2022-12-30 DIAGNOSIS — M810 Age-related osteoporosis without current pathological fracture: Secondary | ICD-10-CM | POA: Diagnosis not present

## 2022-12-31 DIAGNOSIS — M25561 Pain in right knee: Secondary | ICD-10-CM | POA: Diagnosis not present

## 2022-12-31 DIAGNOSIS — M7662 Achilles tendinitis, left leg: Secondary | ICD-10-CM | POA: Diagnosis not present

## 2022-12-31 DIAGNOSIS — M25562 Pain in left knee: Secondary | ICD-10-CM | POA: Diagnosis not present

## 2022-12-31 DIAGNOSIS — G8929 Other chronic pain: Secondary | ICD-10-CM | POA: Diagnosis not present

## 2022-12-31 DIAGNOSIS — M7661 Achilles tendinitis, right leg: Secondary | ICD-10-CM | POA: Diagnosis not present

## 2022-12-31 DIAGNOSIS — R6889 Other general symptoms and signs: Secondary | ICD-10-CM | POA: Diagnosis not present

## 2023-01-14 DIAGNOSIS — H353221 Exudative age-related macular degeneration, left eye, with active choroidal neovascularization: Secondary | ICD-10-CM | POA: Diagnosis not present

## 2023-01-19 ENCOUNTER — Encounter: Payer: Self-pay | Admitting: Urology

## 2023-01-19 ENCOUNTER — Ambulatory Visit: Payer: Medicare Other | Admitting: Urology

## 2023-01-19 VITALS — BP 176/83 | HR 70

## 2023-01-19 DIAGNOSIS — N3281 Overactive bladder: Secondary | ICD-10-CM | POA: Diagnosis not present

## 2023-01-19 DIAGNOSIS — Z87440 Personal history of urinary (tract) infections: Secondary | ICD-10-CM | POA: Diagnosis not present

## 2023-01-19 DIAGNOSIS — R399 Unspecified symptoms and signs involving the genitourinary system: Secondary | ICD-10-CM | POA: Diagnosis not present

## 2023-01-19 DIAGNOSIS — N390 Urinary tract infection, site not specified: Secondary | ICD-10-CM | POA: Diagnosis not present

## 2023-01-19 NOTE — Addendum Note (Signed)
 Addended by: Montgomery Apgar on: 01/19/2023 03:48 PM   Modules accepted: Orders

## 2023-01-19 NOTE — Progress Notes (Addendum)
   Assessment: 1. Lower urinary tract symptoms (LUTS)   2. Recurrent UTI     Plan: Continue UTI prevention including vaginal estrogen cream Will send urine for culture today-hold treatment pending culture results.  Patient is asymptomatic  Follow-up 6 months or sooner if problems arise   ADDENDUM: UC POSITIVE FOR CITROBACTER UTI WILL RX WITH BACTRIM  Chief Complaint: LUTS  HPI: Sherri King is a 83 y.o. female who presents for continued evaluation of lower urinary tract symptoms/OAB, and UTI.  See my note 08/19/2022 at the time of initial visit for detailed history.  Currently Sherri King reports that she is doing very well.  She states that she is using the vaginal estrogen cream regularly and following UTI prevention measures.  She states that her lower urinary tract symptoms have improved significantly and she is very pleased.  She has had no symptomatic UTI since her last follow-up in early September.  Urinalysis today does however show nitrate positivity, pyuria and bacteriuria   Portions of the above documentation were copied from a prior visit for review purposes only.  Allergies: Allergies  Allergen Reactions   Bee Venom Diarrhea, Nausea And Vomiting and Swelling    PMH: Past Medical History:  Diagnosis Date   Anxiety    Arthritis    Cancer (HCC)    cervical   COPD (chronic obstructive pulmonary disease) (HCC)    Coronary vasospasm (HCC)    Depression    GERD (gastroesophageal reflux disease)    Hyperlipidemia    Hypertension    Multiple thyroid nodules     PSH: Past Surgical History:  Procedure Laterality Date   ABDOMINAL HYSTERECTOMY     due to cancer   BREAST BIOPSY Left 08/21/2014   BREAST BIOPSY Right 2016   several "nodules' removed   BREAST EXCISIONAL BIOPSY Left 10/2014   BREAST SURGERY Bilateral 2016   removal of several nodules from right breast   RADIOACTIVE SEED GUIDED EXCISIONAL BREAST BIOPSY Left 10/31/2014   Procedure: RADIOACTIVE  SEED GUIDED EXCISIONAL LEFT BREAST BIOPSY;  Surgeon: Emelia Loron, MD;  Location: [facility];  Service: General;  Laterality: Left;   THYROIDECTOMY, PARTIAL     TUBAL LIGATION     VESICOVAGINAL FISTULA CLOSURE W/ TAH      SH: Social History   Tobacco Use   Smoking status: Former    Current packs/day: 0.00    Average packs/day: 1 pack/day for 60.0 years (60.0 ttl pk-yrs)    Types: Cigarettes    Start date: 04/28/1954    Quit date: 04/28/2014    Years since quitting: 8.7   Smokeless tobacco: Never  Vaping Use   Vaping status: Never Used  Substance Use Topics   Alcohol use: Yes    Alcohol/week: 0.0 standard drinks of alcohol    Comment: rarely a beer   Drug use: No    ROS: Constitutional:  Negative for fever, chills, weight loss CV: Negative for chest pain, previous MI, hypertension Respiratory:  Negative for shortness of breath, wheezing, sleep apnea, frequent cough GI:  Negative for nausea, vomiting, bloody stool, GERD  PE: BP (!) 176/83   Pulse 70  GENERAL APPEARANCE:  Well appearing, well developed, well nourished, NAD    Results: UA is nitrate positive and also shows pyuria and bacteriuria-sent for culture

## 2023-01-20 LAB — URINALYSIS, ROUTINE W REFLEX MICROSCOPIC
Bilirubin, UA: NEGATIVE
Glucose, UA: NEGATIVE
Ketones, UA: NEGATIVE
Nitrite, UA: POSITIVE — AB
Protein,UA: NEGATIVE
Specific Gravity, UA: 1.025 (ref 1.005–1.030)
Urobilinogen, Ur: 0.2 mg/dL (ref 0.2–1.0)
pH, UA: 5.5 (ref 5.0–7.5)

## 2023-01-20 LAB — MICROSCOPIC EXAMINATION

## 2023-01-20 NOTE — Progress Notes (Signed)
Sherri King [facility] [address] [place] [address] Phone: [phone]   Assessment and Plan:     1. Bilateral primary osteoarthritis of knee 2. Chronic pain of both knees  -Chronic with exacerbation, subsequent visit - Consistent with recurrent flare of bilateral knee osteoarthritis - Patient has had significant relief in the past with intra-articular CSI.  Elected for repeat CSI today.  Tolerated well per note below - Patient brought in x-ray imaging from outside sources which included bilateral knee AP and lateral views.  My interpretation: No acute fracture or dislocation.  Moderate degenerative changes in right knee with most significant in lateral and patellofemoral compartments.  Moderate degenerative changes in left knee with most significant in medial and patellofemoral compartments - Muse Tylenol for day-to-day pain relief - Start HEP for knees  Procedure: Knee Joint Injection Side: Bilateral   Indication: Flare of osteoarthritis  Risks explained and consent was given verbally. The site was cleaned with alcohol prep. A needle was introduced with an anterio-lateral approach. Injection given using 2mL of 1% lidocaine without epinephrine and 1mL of kenalog 40mg /ml. This was well tolerated and resulted in symptomatic relief.  Needle was removed, hemostasis achieved, and post injection instructions were explained.  Procedure was repeated on contralateral side pt was advised to call or return to clinic if these symptoms worsen or fail to improve as anticipated.   15 additional minutes spent for educating Therapeutic Home Exercise Program.  This included exercises focusing on stretching, strengthening, with focus on eccentric aspects.   Long term goals include an improvement in range of motion, strength, endurance as well as avoiding reinjury. Patient's frequency would include in 1-2 times a day, 3-5 times a week for a duration of 6-12  weeks. Proper technique shown and discussed handout in great detail with ATC.  All questions were discussed and answered.    Pertinent previous records reviewed include none  Follow Up: As needed if no improvement or worsening of symptoms.  Could consider repeat CSI versus alternative injections.  Patient is not interested in surgical intervention at this time   Subjective:   I, Jerene Canny, am serving as a Neurosurgeon for Doctor Richardean Sale  Chief Complaint: left leg pain   HPI:  08/25/2021 Patient is a 83 year old female complaining of knee and right side hip pain. 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 , cutting small tress, bilateral anterior knee pain ,and hip pain is right between the cheeks, pain is mostly when she walk her left knee doesn't like to bend , she has to compensate when she is going upstairs , downstairs, when she is walking flat ground walking doesn't bother her , has been taking tramadol and tylenol for the pain ,  those help for a couple of hours all depends on what she is doing, she loves to garden so she needs her knees , she does have some lower leg numbness and tingling going on she also states her bones just sometimes hurt    01/21/2023 Patient is a 83 year old female with left leg pain. Patient states pain as before. Has been flared for about year state she fell about  5 times last year. Tramadol has been helping when she takes . Right knee pain radiates down and left stays the same  Relevant Historical Information: History of gastric ulcers, hypertension  Additional pertinent review of systems negative.   Current Outpatient Medications:    acetaminophen (TYLENOL) 500 MG tablet, Take 500 mg by mouth every 6 (six) hours as needed for mild pain. , Disp: , Rfl:    aspirin EC 81 MG tablet, Take 81 mg by mouth daily., Disp: , Rfl:     carboxymethylcellulose (REFRESH PLUS) 0.5 % SOLN, Place 1 drop into both eyes 2 (two) times daily as needed (dryness). , Disp: , Rfl:    cefdinir (OMNICEF) 300 MG capsule, Take 1 capsule (300 mg total) by mouth 2 (two) times daily., Disp: 10 capsule, Rfl: 0   cephALEXin (KEFLEX) 250 MG capsule, TAKE 1 CAPSULE (250 MG TOTAL) BY MOUTH AT BEDTIME. BEGIN TAKING AFTER COMPLETING CEFDINIR TO PREVENT REINFECTION, Disp: 45 capsule, Rfl: 0   cholecalciferol (VITAMIN D) 1000 units tablet, Take 1,000 Units by mouth daily., Disp: , Rfl:    denosumab (PROLIA) 60 MG/ML SOSY injection, 60 mg, Disp: , Rfl:    estradiol (ESTRACE) 0.1 MG/GM vaginal cream, Apply pea-sized amount to finger tip and rub into vaginal area as directed, Disp: 42.5 g, Rfl: 12   metFORMIN (GLUCOPHAGE-XR) 750 MG 24 hr tablet, SMARTSIG:1 Tablet(s) By Mouth Every Evening, Disp: , Rfl:    Multiple Vitamins-Minerals (MULTIVITAMIN WITH MINERALS) tablet, Take 1 tablet by mouth daily., Disp: , Rfl:    nitroGLYCERIN (NITROSTAT) 0.4 MG SL tablet, Place 1 tablet (0.4 mg total) under the tongue every 5 (five) minutes as needed for chest pain., Disp: 25 tablet, Rfl: 11   Omega-3 1000 MG CAPS, Take 1 capsule by mouth daily., Disp: , Rfl:    oxyCODONE-acetaminophen (PERCOCET/ROXICET) 5-325 MG tablet, Take 1 tablet by mouth every 6 (six) hours as needed for severe pain., Disp: 15 tablet, Rfl: 0   Potassium 99 MG TABS, 1 tablet, Disp: , Rfl:    sertraline (ZOLOFT) 50 MG tablet, Take 50 mg by mouth daily as needed (anxiety). , Disp: , Rfl: 4   traMADol (ULTRAM) 50 MG tablet, Take 50-100 mg by mouth every 12 (twelve) hours as needed for moderate pain. , Disp: , Rfl: 0   TRELEGY ELLIPTA 100-62.5-25 MCG/INH AEPB, Inhale 1 puff into the lungs daily. , Disp: , Rfl:    omeprazole (PRILOSEC) 20 MG capsule, Take 1 capsule (20 mg total) by mouth daily for 14 days., Disp: 14 capsule, Rfl: 0   rosuvastatin (CRESTOR) 10 MG tablet, Take 1 tablet (10 mg total) by mouth  daily., Disp: 90 tablet, Rfl: 3   Objective:     Vitals:   01/21/23 1331  BP: 110/72  Pulse: 70  SpO2: 96%  Weight: 165 lb (74.8 kg)  Height: 5\' 4"  (1.626 m)      Body mass index is 28.32 kg/m.    Physical Exam:    General:  awake, alert oriented, no acute distress nontoxic Skin: no suspicious lesions or rashes Neuro:sensation intact, no deficits, strength 5/5 with no deficits, no atrophy, normal muscle tone Psych: No signs of anxiety, depression or other mood disorder   Bilateral knee: No swelling Bony changes along medial joint line, otherwise no deformity Neg fluid wave, joint milking ROM Flex 100, Ext 5 TTP medial joint line, lateral joint line, lateral femoral condyle NTTP over the quad tendon, medial fem condyle,   patella, plica, patella tendon, tibial tuberostiy, fibular head, posterior fossa, pes anserine bursa, gerdy's tubercle  Neg anterior and posterior drawer Neg lachman Neg sag sign Negative  varus stress Negative valgus stress Negative McMurray     Gait normal     Electronically signed by:  Sherri King [facility] 1:53 PM 01/21/23

## 2023-01-21 ENCOUNTER — Ambulatory Visit: Payer: Medicare Other | Admitting: Sports Medicine

## 2023-01-21 VITALS — BP 110/72 | HR 70 | Ht 64.0 in | Wt 165.0 lb

## 2023-01-21 DIAGNOSIS — G8929 Other chronic pain: Secondary | ICD-10-CM

## 2023-01-21 DIAGNOSIS — M170 Bilateral primary osteoarthritis of knee: Secondary | ICD-10-CM | POA: Diagnosis not present

## 2023-01-21 DIAGNOSIS — M25562 Pain in left knee: Secondary | ICD-10-CM | POA: Diagnosis not present

## 2023-01-21 DIAGNOSIS — M25561 Pain in right knee: Secondary | ICD-10-CM | POA: Diagnosis not present

## 2023-01-21 NOTE — Patient Instructions (Signed)
As needed follow  up  Knee HEP

## 2023-01-25 LAB — URINE CULTURE

## 2023-01-25 MED ORDER — NITROFURANTOIN MONOHYD MACRO 100 MG PO CAPS: 100.0000 mg | ORAL_CAPSULE | Freq: Two times a day (BID) | ORAL | 0 refills | Status: DC

## 2023-01-25 NOTE — Addendum Note (Signed)
Addended by: Joline Maxcy on: 01/25/2023 09:09 AM   Modules accepted: Orders

## 2023-02-03 ENCOUNTER — Other Ambulatory Visit: Payer: Self-pay | Admitting: Urology

## 2023-02-11 DIAGNOSIS — H353221 Exudative age-related macular degeneration, left eye, with active choroidal neovascularization: Secondary | ICD-10-CM | POA: Diagnosis not present

## 2023-03-25 DIAGNOSIS — H353221 Exudative age-related macular degeneration, left eye, with active choroidal neovascularization: Secondary | ICD-10-CM | POA: Diagnosis not present

## 2023-04-19 DIAGNOSIS — R109 Unspecified abdominal pain: Secondary | ICD-10-CM | POA: Diagnosis not present

## 2023-04-19 DIAGNOSIS — R195 Other fecal abnormalities: Secondary | ICD-10-CM | POA: Diagnosis not present

## 2023-04-19 DIAGNOSIS — R300 Dysuria: Secondary | ICD-10-CM | POA: Diagnosis not present

## 2023-04-23 ENCOUNTER — Other Ambulatory Visit: Payer: Self-pay | Admitting: Urology

## 2023-05-18 ENCOUNTER — Other Ambulatory Visit: Payer: Self-pay | Admitting: Urology

## 2023-06-17 DIAGNOSIS — H353221 Exudative age-related macular degeneration, left eye, with active choroidal neovascularization: Secondary | ICD-10-CM | POA: Diagnosis not present

## 2023-07-01 DIAGNOSIS — M810 Age-related osteoporosis without current pathological fracture: Secondary | ICD-10-CM | POA: Diagnosis not present

## 2023-07-06 DIAGNOSIS — E785 Hyperlipidemia, unspecified: Secondary | ICD-10-CM | POA: Diagnosis not present

## 2023-07-06 DIAGNOSIS — M810 Age-related osteoporosis without current pathological fracture: Secondary | ICD-10-CM | POA: Diagnosis not present

## 2023-07-06 DIAGNOSIS — I10 Essential (primary) hypertension: Secondary | ICD-10-CM | POA: Diagnosis not present

## 2023-07-11 DIAGNOSIS — E118 Type 2 diabetes mellitus with unspecified complications: Secondary | ICD-10-CM | POA: Diagnosis not present

## 2023-07-11 DIAGNOSIS — K219 Gastro-esophageal reflux disease without esophagitis: Secondary | ICD-10-CM | POA: Diagnosis not present

## 2023-07-11 DIAGNOSIS — R29898 Other symptoms and signs involving the musculoskeletal system: Secondary | ICD-10-CM | POA: Diagnosis not present

## 2023-07-11 DIAGNOSIS — M810 Age-related osteoporosis without current pathological fracture: Secondary | ICD-10-CM | POA: Diagnosis not present

## 2023-07-11 DIAGNOSIS — Z0000 Encounter for general adult medical examination without abnormal findings: Secondary | ICD-10-CM | POA: Diagnosis not present

## 2023-07-11 DIAGNOSIS — J449 Chronic obstructive pulmonary disease, unspecified: Secondary | ICD-10-CM | POA: Diagnosis not present

## 2023-07-11 DIAGNOSIS — I10 Essential (primary) hypertension: Secondary | ICD-10-CM | POA: Diagnosis not present

## 2023-07-11 DIAGNOSIS — Z79899 Other long term (current) drug therapy: Secondary | ICD-10-CM | POA: Diagnosis not present

## 2023-07-11 DIAGNOSIS — I700 Atherosclerosis of aorta: Secondary | ICD-10-CM | POA: Diagnosis not present

## 2023-07-11 DIAGNOSIS — I2510 Atherosclerotic heart disease of native coronary artery without angina pectoris: Secondary | ICD-10-CM | POA: Diagnosis not present

## 2023-07-22 ENCOUNTER — Encounter: Payer: Self-pay | Admitting: Urology

## 2023-07-22 ENCOUNTER — Ambulatory Visit: Payer: Medicare Other | Admitting: Urology

## 2023-07-22 VITALS — BP 132/75 | HR 89 | Ht 61.0 in | Wt 169.0 lb

## 2023-07-22 DIAGNOSIS — R8290 Unspecified abnormal findings in urine: Secondary | ICD-10-CM

## 2023-07-22 DIAGNOSIS — N3281 Overactive bladder: Secondary | ICD-10-CM | POA: Diagnosis not present

## 2023-07-22 DIAGNOSIS — N952 Postmenopausal atrophic vaginitis: Secondary | ICD-10-CM | POA: Diagnosis not present

## 2023-07-22 DIAGNOSIS — N390 Urinary tract infection, site not specified: Secondary | ICD-10-CM

## 2023-07-22 DIAGNOSIS — Z87440 Personal history of urinary (tract) infections: Secondary | ICD-10-CM | POA: Diagnosis not present

## 2023-07-22 LAB — MICROSCOPIC EXAMINATION

## 2023-07-22 LAB — URINALYSIS, ROUTINE W REFLEX MICROSCOPIC
Bilirubin, UA: NEGATIVE
Glucose, UA: NEGATIVE
Nitrite, UA: POSITIVE — AB
Specific Gravity, UA: 1.02 (ref 1.005–1.030)
Urobilinogen, Ur: 0.2 mg/dL (ref 0.2–1.0)
pH, UA: 5.5 (ref 5.0–7.5)

## 2023-07-22 NOTE — Progress Notes (Signed)
 Assessment: 1. Recurrent UTI   2. OAB (overactive bladder)   3. Atrophic vaginitis   4. Abnormal urine findings     Plan: I personally reviewed the patient's chart including provider notes, lab results. Urine culture sent today.  Will contact with results and recommendations for treatment. Will need to consider resuming daily antibiotic for recurrent UTIs. Methods to reduce the risk of UTIs discussed including increase fluid intake, timed and double voiding, daily cranberry supplement, and daily probiotic. Continue vaginal hormone cream. Return to office in 2 months.  Chief Complaint: Chief Complaint  Patient presents with   Recurrent UTI    HPI: Sherri King is a 83 y.o. female who presents for continued evaluation of OAB and history of UTIs. She was previously followed by Dr. Shona and was last seen in January 2025. She has a history of lower urinary tract symptoms with frequency, urgency, occasional urge incontinence, and nocturia. She was diagnosed with a UTI in August 2024. Urine culture grew Klebsiella.  She was started on low-dose Keflex . Urine culture from 1/25 grew Citrobacter.  Treated with Bactrim.  She was changed to daily Macrobid  for UTI suppression. She has also been using vaginal hormone cream for atrophic vaginitis.  She returns today for follow-up.  She is no longer taking the daily Macrobid .  She has not been on this for at least 3 months.  She continues to do the vaginal hormone cream several times a week.  She is not having any dysuria or gross hematuria.  She continues to have urinary incontinence both daytime and nighttime.  She is wearing pads on a regular basis.  Portions of the above documentation were copied from a prior visit for review purposes only.  Allergies: Allergies  Allergen Reactions   Bee Venom Diarrhea, Nausea And Vomiting and Swelling    PMH: Past Medical History:  Diagnosis Date   Anxiety    Arthritis    Cancer (HCC)     cervical   COPD (chronic obstructive pulmonary disease) (HCC)    Coronary vasospasm (HCC)    Depression    GERD (gastroesophageal reflux disease)    Hyperlipidemia    Hypertension    Multiple thyroid  nodules     PSH: Past Surgical History:  Procedure Laterality Date   ABDOMINAL HYSTERECTOMY     due to cancer   BREAST BIOPSY Left 08/21/2014   BREAST BIOPSY Right 2016   several nodules' removed   BREAST EXCISIONAL BIOPSY Left 10/2014   BREAST SURGERY Bilateral 2016   removal of several nodules from right breast   RADIOACTIVE SEED GUIDED EXCISIONAL BREAST BIOPSY Left 10/31/2014   Procedure: RADIOACTIVE SEED GUIDED EXCISIONAL LEFT BREAST BIOPSY;  Surgeon: Donnice Bury, MD;  Location: [facility];  Service: General;  Laterality: Left;   THYROIDECTOMY, PARTIAL     TUBAL LIGATION     VESICOVAGINAL FISTULA CLOSURE W/ TAH      SH: Social History   Tobacco Use   Smoking status: Former    Current packs/day: 0.00    Average packs/day: 1 pack/day for 60.0 years (60.0 ttl pk-yrs)    Types: Cigarettes    Start date: 04/28/1954    Quit date: 04/28/2014    Years since quitting: 9.2   Smokeless tobacco: Never  Vaping Use   Vaping status: Never Used  Substance Use Topics   Alcohol  use: Yes    Alcohol /week: 0.0 standard drinks of alcohol     Comment: rarely a beer   Drug use:  No    ROS: Constitutional:  Negative for fever, chills, weight loss CV: Negative for chest pain, previous MI, hypertension Respiratory:  Negative for shortness of breath, wheezing, sleep apnea, frequent cough GI:  Negative for nausea, vomiting, bloody stool, GERD  PE: BP 132/75   Pulse 89   Ht 5' 1 (1.549 m)   Wt 169 lb (76.7 kg)   BMI 31.93 kg/m  GENERAL APPEARANCE:  Well appearing, well developed, well nourished, NAD HEENT:  Atraumatic, normocephalic, oropharynx clear NECK:  Supple without lymphadenopathy or thyromegaly ABDOMEN:  Soft, non-tender, no masses EXTREMITIES:  Moves  all extremities well, without clubbing, cyanosis, or edema NEUROLOGIC:  Alert and oriented x 3,  CN II-XII grossly intact MENTAL STATUS:  appropriate BACK:  Non-tender to palpation, No CVAT SKIN:  Warm, dry, and intact   Results: U/A: 11-30 WBCs, 0-2 RBCs, many bacteria, nitrite positive

## 2023-07-28 LAB — URINE CULTURE

## 2023-07-31 ENCOUNTER — Ambulatory Visit: Payer: Self-pay | Admitting: Urology

## 2023-07-31 DIAGNOSIS — N390 Urinary tract infection, site not specified: Secondary | ICD-10-CM

## 2023-07-31 MED ORDER — CEFDINIR 300 MG PO CAPS: 300.0000 mg | ORAL_CAPSULE | Freq: Two times a day (BID) | ORAL | 0 refills | Status: DC

## 2023-07-31 MED ORDER — NITROFURANTOIN MONOHYD MACRO 100 MG PO CAPS: 100.0000 mg | ORAL_CAPSULE | Freq: Every day | ORAL | 2 refills | Status: DC

## 2023-08-12 ENCOUNTER — Telehealth: Payer: Self-pay | Admitting: Urology

## 2023-08-12 NOTE — Telephone Encounter (Signed)
 Patient called back. I relayed stonekings message to the patient.

## 2023-08-23 DIAGNOSIS — Z79899 Other long term (current) drug therapy: Secondary | ICD-10-CM | POA: Diagnosis not present

## 2023-08-23 DIAGNOSIS — E1169 Type 2 diabetes mellitus with other specified complication: Secondary | ICD-10-CM | POA: Diagnosis not present

## 2023-08-23 DIAGNOSIS — E785 Hyperlipidemia, unspecified: Secondary | ICD-10-CM | POA: Diagnosis not present

## 2023-08-23 DIAGNOSIS — I10 Essential (primary) hypertension: Secondary | ICD-10-CM | POA: Diagnosis not present

## 2023-08-24 DIAGNOSIS — H353221 Exudative age-related macular degeneration, left eye, with active choroidal neovascularization: Secondary | ICD-10-CM | POA: Diagnosis not present

## 2023-09-22 ENCOUNTER — Encounter: Payer: Self-pay | Admitting: Urology

## 2023-09-22 ENCOUNTER — Ambulatory Visit: Admitting: Urology

## 2023-09-22 ENCOUNTER — Ambulatory Visit

## 2023-09-22 VITALS — BP 149/85 | HR 73 | Ht 63.0 in | Wt 168.0 lb

## 2023-09-22 VITALS — BP 154/92 | HR 77 | Temp 98.0°F | Ht 64.0 in | Wt 169.4 lb

## 2023-09-22 DIAGNOSIS — J439 Emphysema, unspecified: Secondary | ICD-10-CM

## 2023-09-22 DIAGNOSIS — R8290 Unspecified abnormal findings in urine: Secondary | ICD-10-CM

## 2023-09-22 DIAGNOSIS — Z23 Encounter for immunization: Secondary | ICD-10-CM

## 2023-09-22 DIAGNOSIS — J4489 Other specified chronic obstructive pulmonary disease: Secondary | ICD-10-CM | POA: Diagnosis not present

## 2023-09-22 DIAGNOSIS — N3281 Overactive bladder: Secondary | ICD-10-CM

## 2023-09-22 DIAGNOSIS — N390 Urinary tract infection, site not specified: Secondary | ICD-10-CM | POA: Diagnosis not present

## 2023-09-22 DIAGNOSIS — N952 Postmenopausal atrophic vaginitis: Secondary | ICD-10-CM | POA: Diagnosis not present

## 2023-09-22 LAB — MICROSCOPIC EXAMINATION

## 2023-09-22 LAB — URINALYSIS, ROUTINE W REFLEX MICROSCOPIC
Bilirubin, UA: NEGATIVE
Glucose, UA: NEGATIVE
Nitrite, UA: POSITIVE — AB
Protein,UA: NEGATIVE
Specific Gravity, UA: 1.025 (ref 1.005–1.030)
Urobilinogen, Ur: 0.2 mg/dL (ref 0.2–1.0)
pH, UA: 5.5 (ref 5.0–7.5)

## 2023-09-22 MED ORDER — ALBUTEROL SULFATE HFA 108 (90 BASE) MCG/ACT IN AERS: 2.0000 | INHALATION_SPRAY | Freq: Four times a day (QID) | RESPIRATORY_TRACT | 6 refills | Status: AC | PRN

## 2023-09-22 NOTE — Patient Instructions (Addendum)
 Notification of test results are managed in the following manner: If there are any recommendations or changes to the plan of care discussed in office today, we will contact you and let you know what they are. If you do not hear from us , then your results are normal/expected and you can view them through your MyChart account, or a letter will be sent to you. Thank you again for trusting us  with your care [facility].

## 2023-09-22 NOTE — Progress Notes (Signed)
 New Patient Pulmonology Office Visit   Subjective:  Patient ID: Sherri King, female    DOB: 07-23-1940  MRN: [id number]  Referred by: Clarice Nottingham, MD  CC:  Chief Complaint  Patient presents with   Consult    COPD consult Pt states she has been doing pretty good since LOV.  Pt states she has increased SOB when she does not take her inhalers.  No covid or flu vaccine this year.    HPI Sherri King is a 83 y.o. female with moderate COPD, hyperlipidemia, hypertension presents for reestablishing for COPD.  Was following Dr. Forrestine and NP Tammy.  On Trelegy 100. On prn albuterol . Last use 1 year ago. Chronic wheezing. She is happy with her breathing.   No cough. chronic cream phlegm.    Lung Health: Functional status: 1/4-1/2 block.  Covid vaccine: 2 shots.  Influenza vaccine: wants flu vaccine.  Pneumonococcal vaccine: last dose 1993. Will give prevnar 20 RSV done in 2018.  Smoking:  ex smoker 60 yrs x 3/4 pk quit 2016.  Occupational exposure/pets:   TEST/EVENTS :  PFTs 05/07/15  FEV1 1.23 (50%), post-BD FEV1 1.47 [70%], +19% change, RV/TLC 120%, DLCO 62%. Moderate-severe obstruction, positive bronchodilator response. Moderate reduction in diffusion capacity.   PFTs 12/07/16 FEV1 1.18 (62%), TLC 105%, DLCO 63% Moderate obstruction with moderate diffusion defect  CT PE July 2024: Reviewed by me right upper lobe scarring/atelectasis right lower lobe consolidation/atelectasis.  Left lower lobe atelectasis.  No significant emphysematous changes.  No PE.  CT chest July 04, 2022: Similar to above but this did show some mosaic attenuation and underlying emphysematous changes.   Eosinophil as high as 700.  Last in 2024 300.   Allergies: Bee venom  Current Outpatient Medications:    albuterol  (VENTOLIN  HFA) 108 (90 Base) MCG/ACT inhaler, Inhale 2 puffs into the lungs every 6 (six) hours as needed for wheezing or shortness of breath., Disp: 8.5 g, Rfl: 6    aspirin  EC 81 MG tablet, Take 81 mg by mouth daily., Disp: , Rfl:    carboxymethylcellulose (REFRESH PLUS) 0.5 % SOLN, Place 1 drop into both eyes 2 (two) times daily as needed (dryness). , Disp: , Rfl:    cholecalciferol (VITAMIN D) 1000 units tablet, Take 1,000 Units by mouth daily., Disp: , Rfl:    denosumab  (PROLIA ) 60 MG/ML SOSY injection, 60 mg (Patient taking differently: 60 mg), Disp: , Rfl:    estradiol  (ESTRACE ) 0.1 MG/GM vaginal cream, Apply pea-sized amount to finger tip and rub into vaginal area as directed, Disp: 42.5 g, Rfl: 12   metFORMIN (GLUCOPHAGE-XR) 750 MG 24 hr tablet, SMARTSIG:1 Tablet(s) By Mouth Every Evening, Disp: , Rfl:    Multiple Vitamins-Minerals (MULTIVITAMIN WITH MINERALS) tablet, Take 1 tablet by mouth daily., Disp: , Rfl:    nitrofurantoin , macrocrystal-monohydrate, (MACROBID ) 100 MG capsule, Take 1 capsule (100 mg total) by mouth daily. Begin after completing Cefdinir , Disp: 30 capsule, Rfl: 2   nitroGLYCERIN  (NITROSTAT ) 0.4 MG SL tablet, Place 1 tablet (0.4 mg total) under the tongue every 5 (five) minutes as needed for chest pain., Disp: 25 tablet, Rfl: 11   Omega-3 1000 MG CAPS, Take 1 capsule by mouth daily., Disp: , Rfl:    omeprazole  (PRILOSEC) 20 MG capsule, Take 1 capsule (20 mg total) by mouth daily for 14 days., Disp: 14 capsule, Rfl: 0   oxyCODONE -acetaminophen  (PERCOCET/ROXICET) 5-325 MG tablet, Take 1 tablet by mouth every 6 (six) hours as needed for severe pain., Disp:  15 tablet, Rfl: 0   Potassium 99 MG TABS, 1 tablet, Disp: , Rfl:    rosuvastatin  (CRESTOR ) 10 MG tablet, Take 1 tablet (10 mg total) by mouth daily., Disp: 90 tablet, Rfl: 3   sertraline  (ZOLOFT ) 50 MG tablet, Take 50 mg by mouth daily as needed (anxiety). , Disp: , Rfl: 4   traMADol  (ULTRAM ) 50 MG tablet, Take 50-100 mg by mouth every 12 (twelve) hours as needed for moderate pain. , Disp: , Rfl: 0   TRELEGY ELLIPTA 100-62.5-25 MCG/INH AEPB, Inhale 1 puff into the lungs daily. , Disp: ,  Rfl:  Past Medical History:  Diagnosis Date   Anxiety    Arthritis    Cancer (HCC)    cervical   COPD (chronic obstructive pulmonary disease) (HCC)    Coronary vasospasm (HCC)    Depression    GERD (gastroesophageal reflux disease)    Hyperlipidemia    Hypertension    Multiple thyroid  nodules    Past Surgical History:  Procedure Laterality Date   ABDOMINAL HYSTERECTOMY     due to cancer   BREAST BIOPSY Left 08/21/2014   BREAST BIOPSY Right 2016   several nodules' removed   BREAST EXCISIONAL BIOPSY Left 10/2014   BREAST SURGERY Bilateral 2016   removal of several nodules from right breast   RADIOACTIVE SEED GUIDED EXCISIONAL BREAST BIOPSY Left 10/31/2014   Procedure: RADIOACTIVE SEED GUIDED EXCISIONAL LEFT BREAST BIOPSY;  Surgeon: Donnice Bury, MD;  Location: [facility];  Service: General;  Laterality: Left;   THYROIDECTOMY, PARTIAL     TUBAL LIGATION     VESICOVAGINAL FISTULA CLOSURE W/ TAH     Family History  Problem Relation Age of Onset   Heart failure Mother    Stroke Mother    Cancer Father        lung   Cancer Sister        lung, mets to brain   Breast cancer Sister    Cancer Sister        lung   Breast cancer Sister    Heart disease Sister    Allergies Sister        all 8 sisters   Breast cancer Other    Cancer Other        bone   Breast cancer Other    Social History   Socioeconomic History   Marital status: Widowed    Spouse name: Not on file   Number of children: 3   Years of education: Not on file   Highest education level: GED or equivalent  Occupational History   Occupation: BUS DRIVER    Employer: [employer]    Comment: retired  Tobacco Use   Smoking status: Former    Current packs/day: 0.00    Average packs/day: 1 pack/day for 60.0 years (60.0 ttl pk-yrs)    Types: Cigarettes    Start date: 04/28/1954    Quit date: 04/28/2014    Years since quitting: 9.4   Smokeless tobacco: Never   Tobacco comments:     Smoked for about 60 years. Smoked about 3/4 of a ppd.  Vaping Use   Vaping status: Never Used  Substance and Sexual Activity   Alcohol  use: Yes    Alcohol /week: 0.0 standard drinks of alcohol     Comment: rarely a beer   Drug use: No   Sexual activity: Not on file  Other Topics Concern   Not on file  Social History Narrative   **  Merged History Encounter **       Caffeine: 1-2 cups per week Exercise: 2x daily, 3-4 times per week 09/26/18 Widowed, lives with son 3 children Retired Programmer, multimedia, drove a school bus 2 dogs 1 of 8 daughters, 1 brother who lives in [place]  No recent travel    Social Drivers of Corporate investment banker Strain: Not on [employer] Insecurity: Not on file  Transportation Needs: Not on file  Physical Activity: Not on file  Stress: Not on file  Social Connections: Not on file  Intimate Partner Violence: Not on file       Objective:  BP (!) 154/92   Pulse 77   Temp 98 F (36.7 C)   Ht 5' 4 (1.626 m)   Wt 169 lb 6.4 oz (76.8 kg)   SpO2 96% Comment: RA  BMI 29.08 kg/m  BMI Readings from Last 3 Encounters:  09/22/23 29.08 kg/m  07/22/23 31.93 kg/m  01/21/23 28.32 kg/m   SpO2 Readings from Last 3 Encounters:  09/22/23 96%  01/21/23 96%  08/31/22 91%    General: elderly lady with audible wheeze but no wheezing on lung field.  Lungs: prolonged exp breath sounds.  Heart: regular rate rhythm, no murmur appreciated.  Abdomen: non tender, non distended. Normal BS.  Neuro: axox3.  Move all extremities.    Diagnostic Review:  Last metabolic panel Lab Results  Component Value Date   GLUCOSE 140 (H) 07/16/2022   NA 138 07/16/2022   K 3.6 07/16/2022   CL 107 07/16/2022   CO2 23 07/16/2022   BUN 12 07/16/2022   CREATININE 0.63 07/16/2022   GFRNONAA >60 07/16/2022   CALCIUM  8.3 (L) 07/16/2022   PROT 6.7 07/16/2022   ALBUMIN 3.3 (L) 07/16/2022   BILITOT 0.6 07/16/2022   ALKPHOS 111 07/16/2022   AST 22 07/16/2022    ALT 19 07/16/2022   ANIONGAP 8 07/16/2022       Assessment & Plan:   Assessment & Plan COPD with chronic bronchitis and emphysema (HCC) Reviewed CT scans as above. Doing well. On trelegy 100. Rarely use albuterol . Cont trelegy. Advised gargling mouth and reinforced inhaler technique.  - PFT - Flu and Prevnar 20.   Orders Placed This Encounter  Procedures   Pneumococcal conjugate vaccine 20-valent (Prevnar-20)   Flu vaccine trivalent PF, 6mos and older(Flulaval,Afluria,Fluarix,Fluzone)   Pulmonary Function Test      Return in about 6 months (around 03/21/2024).   Sarai January, MD

## 2023-09-22 NOTE — Progress Notes (Signed)
 Assessment: 1. Recurrent UTI   2. OAB (overactive bladder)   3. Atrophic vaginitis   4. Abnormal urine findings     Plan: Continue methods to reduce the risk of UTIs  including increase fluid intake, timed and double voiding, daily cranberry supplement, and daily probiotic. Continue vaginal hormone cream. Continue daily Macrobid  Urine culture sent today -she is not currently symptomatic and no treatment may be necessary. Return to office in 3 months.  Chief Complaint: Chief Complaint  Patient presents with   Recurrent UTI    HPI: Sherri King is a 83 y.o. female who presents for continued evaluation of OAB and history of UTIs. She was previously followed by Dr. Shona and was last seen in January 2025. She has a history of lower urinary tract symptoms with frequency, urgency, occasional urge incontinence, and nocturia. She was diagnosed with a UTI in August 2024. Urine culture grew Klebsiella.  She was started on low-dose Keflex . Urine culture from 1/25 grew Citrobacter.  Treated with Bactrim.  She was changed to daily Macrobid  for UTI suppression. She has also been using vaginal hormone cream for atrophic vaginitis.  At her visit in July 2025, she was no longer taking the daily Macrobid .  She had not been on this for at least 3 months.  She continued to use the vaginal hormone cream several times a week.  She was not having any dysuria or gross hematuria.  She continued to have urinary incontinence both daytime and nighttime.  She was wearing pads on a regular basis. Urine culture grew >100 K Citrobacter.  Treated with cefdinir  and restarted on daily Macrobid .  She returns today for follow-up.  She continues on Macrobid  daily.  She is not having any UTI symptoms at the present time.  No dysuria or gross hematuria.  Her incontinence remains unchanged.  She continues to use vaginal hormone cream several times a week.  Portions of the above documentation were copied from a  prior visit for review purposes only.  Allergies: Allergies  Allergen Reactions   Bee Venom Diarrhea, Nausea And Vomiting and Swelling    PMH: Past Medical History:  Diagnosis Date   Anxiety    Arthritis    Cancer (HCC)    cervical   COPD (chronic obstructive pulmonary disease) (HCC)    Coronary vasospasm (HCC)    Depression    GERD (gastroesophageal reflux disease)    Hyperlipidemia    Hypertension    Multiple thyroid  nodules     PSH: Past Surgical History:  Procedure Laterality Date   ABDOMINAL HYSTERECTOMY     due to cancer   BREAST BIOPSY Left 08/21/2014   BREAST BIOPSY Right 2016   several nodules' removed   BREAST EXCISIONAL BIOPSY Left 10/2014   BREAST SURGERY Bilateral 2016   removal of several nodules from right breast   RADIOACTIVE SEED GUIDED EXCISIONAL BREAST BIOPSY Left 10/31/2014   Procedure: RADIOACTIVE SEED GUIDED EXCISIONAL LEFT BREAST BIOPSY;  Surgeon: Donnice Bury, MD;  Location: [facility];  Service: General;  Laterality: Left;   THYROIDECTOMY, PARTIAL     TUBAL LIGATION     VESICOVAGINAL FISTULA CLOSURE W/ TAH      SH: Social History   Tobacco Use   Smoking status: Former    Current packs/day: 0.00    Average packs/day: 1 pack/day for 60.0 years (60.0 ttl pk-yrs)    Types: Cigarettes    Start date: 04/28/1954    Quit date: 04/28/2014  Years since quitting: 9.4   Smokeless tobacco: Never   Tobacco comments:    Smoked for about 60 years. Smoked about 3/4 of a ppd.  Vaping Use   Vaping status: Never Used  Substance Use Topics   Alcohol  use: Yes    Alcohol /week: 0.0 standard drinks of alcohol     Comment: rarely a beer   Drug use: No    ROS: Constitutional:  Negative for fever, chills, weight loss CV: Negative for chest pain, previous MI, hypertension Respiratory:  Negative for shortness of breath, wheezing, sleep apnea, frequent cough GI:  Negative for nausea, vomiting, bloody stool, GERD   PE: BP (!)  149/85   Pulse 73   Ht 5' 3 (1.6 m)   Wt 168 lb (76.2 kg)   BMI 29.76 kg/m  GENERAL APPEARANCE:  Well appearing, well developed, well nourished, NAD HEENT:  Atraumatic, normocephalic, oropharynx clear NECK:  Supple without lymphadenopathy or thyromegaly ABDOMEN:  Soft, non-tender, no masses EXTREMITIES:  Moves all extremities well, without clubbing, cyanosis, or edema NEUROLOGIC:  Alert and oriented x 3, normal gait, CN II-XII grossly intact MENTAL STATUS:  appropriate BACK:  Non-tender to palpation, No CVAT SKIN:  Warm, dry, and intact   Results: U/A: 11-30 WBCs, 3-10 RBCs, many bacteria, nitrite positive

## 2023-09-26 LAB — URINE CULTURE

## 2023-09-27 ENCOUNTER — Ambulatory Visit: Payer: Self-pay | Admitting: Urology

## 2023-10-27 DIAGNOSIS — M810 Age-related osteoporosis without current pathological fracture: Secondary | ICD-10-CM | POA: Diagnosis not present

## 2023-10-28 DIAGNOSIS — H353221 Exudative age-related macular degeneration, left eye, with active choroidal neovascularization: Secondary | ICD-10-CM | POA: Diagnosis not present

## 2023-11-10 ENCOUNTER — Other Ambulatory Visit: Payer: Self-pay | Admitting: Internal Medicine

## 2023-11-10 DIAGNOSIS — R413 Other amnesia: Secondary | ICD-10-CM

## 2023-11-29 ENCOUNTER — Other Ambulatory Visit: Payer: Self-pay | Admitting: Urology

## 2023-11-29 DIAGNOSIS — N390 Urinary tract infection, site not specified: Secondary | ICD-10-CM

## 2023-12-24 ENCOUNTER — Other Ambulatory Visit: Payer: Self-pay | Admitting: Urology

## 2023-12-24 DIAGNOSIS — N390 Urinary tract infection, site not specified: Secondary | ICD-10-CM

## 2023-12-27 ENCOUNTER — Encounter

## 2023-12-27 ENCOUNTER — Ambulatory Visit

## 2024-01-04 ENCOUNTER — Ambulatory Visit: Admitting: Urology

## 2024-01-04 NOTE — Progress Notes (Deleted)
 "  Assessment: 1. Recurrent UTI   2. OAB (overactive bladder)   3. Atrophic vaginitis     Plan: Continue methods to reduce the risk of UTIs  including increase fluid intake, timed and double voiding, daily cranberry supplement, and daily probiotic. Continue vaginal hormone cream. Continue daily Macrobid  Urine culture sent today -she is not currently symptomatic and no treatment may be necessary. Return to office in 3 months.  Chief Complaint: No chief complaint on file.   HPI: Sherri King is a 83 y.o. female who presents for continued evaluation of OAB and history of UTIs. She was previously followed by Dr. Shona. She has a history of lower urinary tract symptoms with frequency, urgency, occasional urge incontinence, and nocturia. She was diagnosed with a UTI in August 2024. Urine culture grew Klebsiella.  She was started on low-dose Keflex . Urine culture from 1/25 grew Citrobacter.  Treated with Bactrim.  She was changed to daily Macrobid  for UTI suppression. She has also been using vaginal hormone cream for atrophic vaginitis.  At her visit in July 2025, she was no longer taking the daily Macrobid .  She had not been on this for at least 3 months.  She continued to use the vaginal hormone cream several times a week.  She was not having any dysuria or gross hematuria.  She continued to have urinary incontinence both daytime and nighttime.  She was wearing pads on a regular basis. Urine culture grew >100 K Citrobacter.  Treated with cefdinir  and restarted on daily Macrobid .  At her visit in September 2025, she continued on Macrobid  daily.  She was not having any UTI symptoms at the time of her visit.  No dysuria or gross hematuria.  Her incontinence remained unchanged.  She continued to use vaginal hormone cream several times a week. Urine culture obtained due to abnormal urinalysis.  Culture grew >100 K Klebsiella.  Since she was asymptomatic no treatment recommended.  She  continued on daily Macrobid .  Portions of the above documentation were copied from a prior visit for review purposes only.  Allergies: Allergies  Allergen Reactions   Bee Venom Diarrhea, Nausea And Vomiting and Swelling    PMH: Past Medical History:  Diagnosis Date   Anxiety    Arthritis    Cancer (HCC)    cervical   COPD (chronic obstructive pulmonary disease) (HCC)    Coronary vasospasm    Depression    GERD (gastroesophageal reflux disease)    Hyperlipidemia    Hypertension    Multiple thyroid  nodules     PSH: Past Surgical History:  Procedure Laterality Date   ABDOMINAL HYSTERECTOMY     due to cancer   BREAST BIOPSY Left 08/21/2014   BREAST BIOPSY Right 2016   several nodules' removed   BREAST EXCISIONAL BIOPSY Left 10/2014   BREAST SURGERY Bilateral 2016   removal of several nodules from right breast   RADIOACTIVE SEED GUIDED EXCISIONAL BREAST BIOPSY Left 10/31/2014   Procedure: RADIOACTIVE SEED GUIDED EXCISIONAL LEFT BREAST BIOPSY;  Surgeon: Donnice Bury, MD;  Location: [facility];  Service: General;  Laterality: Left;   THYROIDECTOMY, PARTIAL     TUBAL LIGATION     VESICOVAGINAL FISTULA CLOSURE W/ TAH      SH: Social History   Tobacco Use   Smoking status: Former    Current packs/day: 0.00    Average packs/day: 1 pack/day for 60.0 years (60.0 ttl pk-yrs)    Types: Cigarettes    Start date:  04/28/1954    Quit date: 04/28/2014    Years since quitting: 9.6   Smokeless tobacco: Never   Tobacco comments:    Smoked for about 60 years. Smoked about 3/4 of a ppd.  Vaping Use   Vaping status: Never Used  Substance Use Topics   Alcohol  use: Yes    Alcohol /week: 0.0 standard drinks of alcohol     Comment: rarely a beer   Drug use: No    ROS: Constitutional:  Negative for fever, chills, weight loss CV: Negative for chest pain, previous MI, hypertension Respiratory:  Negative for shortness of breath, wheezing, sleep apnea, frequent  cough GI:  Negative for nausea, vomiting, bloody stool, GERD   PE: There were no vitals taken for this visit. GENERAL APPEARANCE:  Well appearing, well developed, well nourished, NAD HEENT:  Atraumatic, normocephalic, oropharynx clear NECK:  Supple without lymphadenopathy or thyromegaly ABDOMEN:  Soft, non-tender, no masses EXTREMITIES:  Moves all extremities well, without clubbing, cyanosis, or edema NEUROLOGIC:  Alert and oriented x 3, normal gait, CN II-XII grossly intact MENTAL STATUS:  appropriate BACK:  Non-tender to palpation, No CVAT SKIN:  Warm, dry, and intact   Results: U/A:  "

## 2024-01-25 ENCOUNTER — Other Ambulatory Visit: Payer: Self-pay | Admitting: Urology

## 2024-01-25 DIAGNOSIS — N390 Urinary tract infection, site not specified: Secondary | ICD-10-CM

## 2024-02-03 ENCOUNTER — Ambulatory Visit: Admitting: Urology

## 2024-02-09 ENCOUNTER — Ambulatory Visit: Admitting: Urology

## 2024-02-09 NOTE — Progress Notes (Unsigned)
 "  Assessment: 1. Recurrent UTI   2. OAB (overactive bladder)   3. Atrophic vaginitis     Plan: Continue methods to reduce the risk of UTIs  including increase fluid intake, timed and double voiding, daily cranberry supplement, and daily probiotic. Continue vaginal hormone cream. Continue daily Macrobid  Urine culture sent today -she is not currently symptomatic and no treatment may be necessary. Return to office in 3 months.  Chief Complaint: No chief complaint on file.   HPI: Sherri King is a 84 y.o. female who presents for continued evaluation of OAB and history of UTIs. She was previously followed by Dr. Shona and was last seen in January 2025. She has a history of lower urinary tract symptoms with frequency, urgency, occasional urge incontinence, and nocturia. She was diagnosed with a UTI in August 2024. Urine culture grew Klebsiella.  She was started on low-dose Keflex . Urine culture from 1/25 grew Citrobacter.  Treated with Bactrim.  She was changed to daily Macrobid  for UTI suppression. She has also been using vaginal hormone cream for atrophic vaginitis.  At her visit in July 2025, she was no longer taking the daily Macrobid .  She had not been on this for at least 3 months.  She continued to use the vaginal hormone cream several times a week.  She was not having any dysuria or gross hematuria.  She continued to have urinary incontinence both daytime and nighttime.  She was wearing pads on a regular basis. Urine culture grew >100 K Citrobacter.  Treated with cefdinir  and restarted on daily Macrobid .  At her visit in September 2025, she continued on Macrobid  daily.  She was not having any UTI symptoms.  No dysuria or gross hematuria.  Her incontinence remained unchanged.  She continued to use vaginal hormone cream several times a week. Urine culture from 9/25 grew >100 K E. coli.  Portions of the above documentation were copied from a prior visit for review purposes  only.  Allergies: Allergies  Allergen Reactions   Bee Venom Diarrhea, Nausea And Vomiting and Swelling    PMH: Past Medical History:  Diagnosis Date   Anxiety    Arthritis    Cancer (HCC)    cervical   COPD (chronic obstructive pulmonary disease) (HCC)    Coronary vasospasm    Depression    GERD (gastroesophageal reflux disease)    Hyperlipidemia    Hypertension    Multiple thyroid  nodules     PSH: Past Surgical History:  Procedure Laterality Date   ABDOMINAL HYSTERECTOMY     due to cancer   BREAST BIOPSY Left 08/21/2014   BREAST BIOPSY Right 2016   several nodules' removed   BREAST EXCISIONAL BIOPSY Left 10/2014   BREAST SURGERY Bilateral 2016   removal of several nodules from right breast   RADIOACTIVE SEED GUIDED EXCISIONAL BREAST BIOPSY Left 10/31/2014   Procedure: RADIOACTIVE SEED GUIDED EXCISIONAL LEFT BREAST BIOPSY;  Surgeon: Donnice Bury, MD;  Location: [facility];  Service: General;  Laterality: Left;   THYROIDECTOMY, PARTIAL     TUBAL LIGATION     VESICOVAGINAL FISTULA CLOSURE W/ TAH      SH: Social History   Tobacco Use   Smoking status: Former    Current packs/day: 0.00    Average packs/day: 1 pack/day for 60.0 years (60.0 ttl pk-yrs)    Types: Cigarettes    Start date: 04/28/1954    Quit date: 04/28/2014    Years since quitting: 9.7   Smokeless  tobacco: Never   Tobacco comments:    Smoked for about 60 years. Smoked about 3/4 of a ppd.  Vaping Use   Vaping status: Never Used  Substance Use Topics   Alcohol  use: Yes    Alcohol /week: 0.0 standard drinks of alcohol     Comment: rarely a beer   Drug use: No    ROS: Constitutional:  Negative for fever, chills, weight loss CV: Negative for chest pain, previous MI, hypertension Respiratory:  Negative for shortness of breath, wheezing, sleep apnea, frequent cough GI:  Negative for nausea, vomiting, bloody stool, GERD   PE: There were no vitals taken for this  visit. GENERAL APPEARANCE:  Well appearing, well developed, well nourished, NAD HEENT:  Atraumatic, normocephalic, oropharynx clear NECK:  Supple without lymphadenopathy or thyromegaly ABDOMEN:  Soft, non-tender, no masses EXTREMITIES:  Moves all extremities well, without clubbing, cyanosis, or edema NEUROLOGIC:  Alert and oriented x 3, normal gait, CN II-XII grossly intact MENTAL STATUS:  appropriate BACK:  Non-tender to palpation, No CVAT SKIN:  Warm, dry, and intact   Results: U/A:  "

## 2024-03-28 ENCOUNTER — Encounter

## 2024-03-28 ENCOUNTER — Ambulatory Visit
